# Patient Record
Sex: Female | Born: 1960 | Race: White | Hispanic: No | Marital: Married | Smoking: Never smoker
Health system: Southern US, Community
[De-identification: ages and names within clinical notes are randomized; demographics above are authoritative.]

## PROBLEM LIST (undated history)

## (undated) DIAGNOSIS — R011 Cardiac murmur, unspecified: Secondary | ICD-10-CM

## (undated) DIAGNOSIS — E039 Hypothyroidism, unspecified: Secondary | ICD-10-CM

## (undated) DIAGNOSIS — E079 Disorder of thyroid, unspecified: Secondary | ICD-10-CM

## (undated) DIAGNOSIS — G43909 Migraine, unspecified, not intractable, without status migrainosus: Secondary | ICD-10-CM

## (undated) DIAGNOSIS — M169 Osteoarthritis of hip, unspecified: Secondary | ICD-10-CM

## (undated) HISTORY — PX: BREAST EXCISIONAL BIOPSY: SUR124

## (undated) HISTORY — PX: ABDOMINAL HYSTERECTOMY: SHX81

## (undated) HISTORY — PX: TMJ ARTHROPLASTY: SHX1066

## (undated) HISTORY — PX: TONSILLECTOMY: SUR1361

## (undated) HISTORY — PX: TRIGGER FINGER RELEASE: SHX641

## (undated) HISTORY — PX: FOOT SURGERY: SHX648

---

## 2012-09-03 ENCOUNTER — Other Ambulatory Visit: Payer: Self-pay | Admitting: Internal Medicine

## 2012-09-03 DIAGNOSIS — Z1231 Encounter for screening mammogram for malignant neoplasm of breast: Secondary | ICD-10-CM

## 2012-09-26 ENCOUNTER — Ambulatory Visit
Admission: RE | Admit: 2012-09-26 | Discharge: 2012-09-26 | Disposition: A | Discharge status: Home / Self Care | Payer: Managed Care, Other (non HMO) | Source: Ambulatory Visit | Attending: Internal Medicine | Admitting: Internal Medicine

## 2012-09-26 ENCOUNTER — Ambulatory Visit: Payer: Self-pay

## 2012-09-26 DIAGNOSIS — Z1231 Encounter for screening mammogram for malignant neoplasm of breast: Secondary | ICD-10-CM

## 2012-09-27 ENCOUNTER — Other Ambulatory Visit: Payer: Self-pay | Admitting: Internal Medicine

## 2012-09-27 DIAGNOSIS — R928 Other abnormal and inconclusive findings on diagnostic imaging of breast: Secondary | ICD-10-CM

## 2012-10-15 ENCOUNTER — Ambulatory Visit
Admission: RE | Admit: 2012-10-15 | Discharge: 2012-10-15 | Disposition: A | Discharge status: Home / Self Care | Payer: Managed Care, Other (non HMO) | Source: Ambulatory Visit | Attending: Internal Medicine | Admitting: Internal Medicine

## 2012-10-15 DIAGNOSIS — R928 Other abnormal and inconclusive findings on diagnostic imaging of breast: Secondary | ICD-10-CM

## 2013-03-17 ENCOUNTER — Other Ambulatory Visit: Payer: Self-pay | Admitting: Internal Medicine

## 2013-03-17 DIAGNOSIS — N6310 Unspecified lump in the right breast, unspecified quadrant: Secondary | ICD-10-CM

## 2013-04-01 ENCOUNTER — Ambulatory Visit
Admission: RE | Admit: 2013-04-01 | Discharge: 2013-04-01 | Disposition: A | Discharge status: Home / Self Care | Payer: Self-pay | Source: Ambulatory Visit | Attending: Internal Medicine | Admitting: Internal Medicine

## 2013-04-01 ENCOUNTER — Ambulatory Visit
Admission: RE | Admit: 2013-04-01 | Discharge: 2013-04-01 | Disposition: A | Discharge status: Home / Self Care | Payer: Managed Care, Other (non HMO) | Source: Ambulatory Visit | Attending: Internal Medicine | Admitting: Internal Medicine

## 2013-04-01 ENCOUNTER — Other Ambulatory Visit: Payer: Self-pay | Admitting: Internal Medicine

## 2013-04-01 DIAGNOSIS — N6310 Unspecified lump in the right breast, unspecified quadrant: Secondary | ICD-10-CM

## 2013-04-29 ENCOUNTER — Other Ambulatory Visit: Payer: Self-pay | Admitting: Internal Medicine

## 2013-04-29 ENCOUNTER — Ambulatory Visit
Admission: RE | Admit: 2013-04-29 | Discharge: 2013-04-29 | Disposition: A | Discharge status: Home / Self Care | Payer: Managed Care, Other (non HMO) | Source: Ambulatory Visit | Attending: Internal Medicine | Admitting: Internal Medicine

## 2013-04-29 DIAGNOSIS — R0789 Other chest pain: Secondary | ICD-10-CM

## 2013-08-16 ENCOUNTER — Encounter (HOSPITAL_COMMUNITY): Payer: Self-pay | Admitting: Emergency Medicine

## 2013-08-16 ENCOUNTER — Emergency Department (HOSPITAL_COMMUNITY)
Admission: EM | Admit: 2013-08-16 | Discharge: 2013-08-16 | Disposition: A | Discharge status: Home / Self Care | Payer: Managed Care, Other (non HMO) | Attending: Emergency Medicine | Admitting: Emergency Medicine

## 2013-08-16 DIAGNOSIS — G43711 Chronic migraine without aura, intractable, with status migrainosus: Secondary | ICD-10-CM

## 2013-08-16 DIAGNOSIS — Z3202 Encounter for pregnancy test, result negative: Secondary | ICD-10-CM | POA: Insufficient documentation

## 2013-08-16 DIAGNOSIS — E079 Disorder of thyroid, unspecified: Secondary | ICD-10-CM | POA: Insufficient documentation

## 2013-08-16 DIAGNOSIS — G43009 Migraine without aura, not intractable, without status migrainosus: Secondary | ICD-10-CM | POA: Insufficient documentation

## 2013-08-16 DIAGNOSIS — Z79899 Other long term (current) drug therapy: Secondary | ICD-10-CM | POA: Insufficient documentation

## 2013-08-16 HISTORY — DX: Disorder of thyroid, unspecified: E07.9

## 2013-08-16 HISTORY — DX: Migraine, unspecified, not intractable, without status migrainosus: G43.909

## 2013-08-16 LAB — POC URINE PREG, ED: PREG TEST UR: NEGATIVE

## 2013-08-16 MED ORDER — HYDROMORPHONE HCL PF 1 MG/ML IJ SOLN
1.0000 mg | Freq: Once | INTRAMUSCULAR | Status: AC
  Administered 2013-08-16: 1 mg via INTRAVENOUS
  Filled 2013-08-16: qty 1

## 2013-08-16 MED ORDER — KETOROLAC TROMETHAMINE 30 MG/ML IJ SOLN
30.0000 mg | Freq: Once | INTRAMUSCULAR | Status: AC
  Administered 2013-08-16: 30 mg via INTRAVENOUS
  Filled 2013-08-16: qty 1

## 2013-08-16 MED ORDER — SODIUM CHLORIDE 0.9 % IV BOLUS (SEPSIS)
1000.0000 mL | Freq: Once | INTRAVENOUS | Status: AC
  Administered 2013-08-16: 1000 mL via INTRAVENOUS

## 2013-08-16 MED ORDER — PROMETHAZINE HCL 25 MG/ML IJ SOLN
12.5000 mg | Freq: Once | INTRAMUSCULAR | Status: AC
  Administered 2013-08-16: 12.5 mg via INTRAVENOUS
  Filled 2013-08-16: qty 1

## 2013-08-16 NOTE — Discharge Instructions (Signed)
Recurrent Migraine Headache °A migraine headache is very bad, throbbing pain on one or both sides of your head. Recurrent migraines keep coming back. Talk to your doctor about what things may bring on (trigger) your migraine headaches. °HOME CARE °· Only take medicines as told by your doctor. °· Lie down in a dark, quiet room when you have a migraine. °· Keep a journal to find out if certain things bring on migraine headaches. For example, write down: °¨ What you eat and drink. °¨ How much sleep you get. °¨ Any change to your diet or medicines. °· Lessen how much alcohol you drink. °· Quit smoking if you smoke. °· Get enough sleep. °· Lessen any stress in your life. °· Keep lights dim if bright lights bother you or make your migraines worse. °GET HELP IF: °· Medicine does not help your migraines. °· Your pain keeps coming back. °· You have a fever. °GET HELP RIGHT AWAY IF:  °· Your migraine becomes really bad. °· You have a stiff neck. °· You have trouble seeing. °· Your muscles are weak, or you lose muscle control. °· You lose your balance or have trouble walking. °· You feel like you will pass out (faint), or you pass out. °· You have really bad symptoms that are different than your first symptoms. °MAKE SURE YOU:  °· Understand these instructions. °· Will watch your condition. °· Will get help right away if you are not doing well or get worse. °Document Released: 11/16/2007 Document Revised: 02/11/2013 Document Reviewed: 10/14/2012 °Exit[employer]® Patient Information ©2015 [employer]. This information is not intended to replace advice given to you by your health care provider. Make sure you discuss any questions you have with your health care provider. ° °

## 2013-08-16 NOTE — ED Provider Notes (Signed)
CSN: [id number]     Arrival date & time 08/16/13  0447 History   First MD Initiated Contact with Patient 08/16/13 0701     Chief Complaint  Patient presents with  . Migraine     (Consider location/radiation/quality/duration/timing/severity/associated sxs/prior Treatment) HPI Comments: 53 y.o. Female with histrory of migraines for " a long time."  She is followed by Dr. Lavone Orn for primary care.  She [facility] Dr. Domingo Cocking yesterday.  She states she was given multiple steroid injections along neck for trigger point therapy.  She began having a migraine after that that began as her usual migraine.  She has been taken off all her migraine medications.  She states she usually uses maxalt but has been using it every day instead of just for relief.  This is because her headaches had become daily.  She also has nausea and vomiting with these.   She denies visual changes, head injury, sudden onset of headache, lateralized weakness, sore throat, neck stiffness, fever, chills or abdominal pain.  She feels this is her typical migraine.    Patient is a 53 y.o. female presenting with migraines. The history is provided by the patient.  Migraine This is a recurrent problem. The current episode started 12 to 24 hours ago. The problem occurs constantly. The problem has not changed since onset.Pertinent negatives include no chest pain, no abdominal pain, no headaches and no shortness of breath. Exacerbated by: [place]. Nothing relieves the symptoms. She has tried nothing for the symptoms. The treatment provided no relief.    Past Medical History  Diagnosis Date  . Migraine headache   . Thyroid disease    Past Surgical History  Procedure Laterality Date  . Tonsillectomy    . Abdominal hysterectomy    . Tmj arthroplasty    . Cesarean section    . Foot surgery     No family history on file. History  Substance Use Topics  . Smoking status: Never Smoker    . Smokeless tobacco: Not on file  . Alcohol Use: No   OB History   Grav Para Term Preterm Abortions TAB SAB Ect Mult Living                 Review of Systems  Respiratory: Negative for shortness of breath.   Cardiovascular: Negative for chest pain.  Gastrointestinal: Negative for abdominal pain.  Neurological: Negative for headaches.  All other systems reviewed and are negative.     Allergies  Indomethacin; Keflex; Percocet; and Tramadol  Home Medications   Prior to Admission medications   Medication Sig Start Date End Date Taking? Authorizing Provider  baclofen (LIORESAL) 10 MG tablet Take 10 mg by mouth 2 (two) times daily. 08/14/13  Yes Historical Provider, MD  levothyroxine (SYNTHROID, LEVOTHROID) 75 MCG tablet Take 75 mcg by mouth daily before breakfast.   Yes Historical Provider, MD  metoprolol tartrate (LOPRESSOR) 25 MG tablet Take 25 mg by mouth 2 (two) times daily. 07/08/13  Yes Historical Provider, MD  naratriptan (AMERGE) 2.5 MG tablet Take 2.5 mg by mouth daily. 07/22/13  Yes Historical Provider, MD  rizatriptan (MAXALT-MLT) 10 MG disintegrating tablet Take 10 mg by mouth as needed for migraine.  07/22/13  Yes Historical Provider, MD  zonisamide (ZONEGRAN) 25 MG capsule Take 25 mg by mouth 4 (four) times daily. 08/14/13  Yes Historical Provider, MD   BP 140/66  Pulse 70  Temp(Src) 97.9 F (36.6 C)  Resp 16  SpO2 98% Physical Exam  Nursing note and vitals reviewed. Constitutional: She is oriented to person, place, and time. She appears well-developed and well-nourished.  HENT:  Head: Normocephalic and atraumatic.  Right Ear: Tympanic membrane and external ear normal.  Left Ear: Tympanic membrane and external ear normal.  Nose: Nose normal. Right sinus exhibits no maxillary sinus tenderness and no frontal sinus tenderness. Left sinus exhibits no maxillary sinus tenderness and no frontal sinus tenderness.  Eyes: Conjunctivae and EOM are normal. Pupils are equal,  round, and reactive to light. Right eye exhibits no nystagmus. Left eye exhibits no nystagmus.  Neck: Normal range of motion. Neck supple.  Cardiovascular: Normal rate, regular rhythm, normal heart sounds and intact distal pulses.   Pulmonary/Chest: Effort normal and breath sounds normal. No respiratory distress. She exhibits no tenderness.  Abdominal: Soft. Bowel sounds are normal. She exhibits no distension and no mass. There is no tenderness.  Musculoskeletal: Normal range of motion. She exhibits no edema and no tenderness.  Neurological: She is alert and oriented to person, place, and time. She has normal strength and normal reflexes. No sensory deficit. She displays a negative Romberg sign. GCS eye subscore is 4. GCS verbal subscore is 5. GCS motor subscore is 6.  Reflex Scores:      Tricep reflexes are 2+ on the right side and 2+ on the left side.      Bicep reflexes are 2+ on the right side and 2+ on the left side.      Brachioradialis reflexes are 2+ on the right side and 2+ on the left side.      Patellar reflexes are 2+ on the right side and 2+ on the left side.      Achilles reflexes are 2+ on the right side and 2+ on the left side. Patient with normal gait without ataxia, shuffling, spasm, or antalgia. Speech is normal without dysarthria, dysphasia, or aphasia. Muscle strength is 5/5 in bilateral shoulders, elbow flexor and extensors, wrist flexor and extensors, and intrinsic hand muscles. 5/5 bilateral lower extremity hip flexors, extensors, knee flexors and extensors, and ankle dorsi and plantar flexors.    Skin: Skin is warm and dry. No rash noted.  Psychiatric: She has a normal mood and affect. Her behavior is normal. Judgment and thought content normal.    ED Course  Procedures (including critical care time) Labs Review Labs Reviewed  POC URINE PREG, ED    Imaging Review No results found.   EKG Interpretation None      MDM   Final diagnoses:  Intractable  chronic migraine without aura and with status migrainosus    53 year old female with history of migraines has been off of all her medications. She is given Toradol, Phenergan, and one half milligram of Dilaudid here in the emergency department with complete resolution of her symptoms. She is somewhat sleepy but continues to have a nonfocal neurological exam. I discussed this with her and her husband. Her husband is comfortable taking her home. I discussed return precautions and need for follow up in her voice understanding.    Shaune Pollack, MD 08/16/13 1031

## 2013-08-16 NOTE — ED Notes (Signed)
Pt. reports migraine headache with emesis and photophobia onset last night unrelieved by prescription medications .

## 2013-08-16 NOTE — ED Notes (Signed)
Dr. Jeanell Sparrow at the bedside.

## 2013-08-16 NOTE — ED Notes (Signed)
Pt up ambulated to the bathroom without any difficulty or distress; pt attempting to provide an urine specimen; family at bedside

## 2013-09-03 ENCOUNTER — Other Ambulatory Visit: Payer: Self-pay

## 2013-09-03 DIAGNOSIS — Z1231 Encounter for screening mammogram for malignant neoplasm of breast: Secondary | ICD-10-CM

## 2013-09-30 ENCOUNTER — Ambulatory Visit: Payer: Managed Care, Other (non HMO)

## 2013-10-14 ENCOUNTER — Other Ambulatory Visit: Payer: Self-pay | Admitting: Internal Medicine

## 2013-10-14 ENCOUNTER — Ambulatory Visit
Admission: RE | Admit: 2013-10-14 | Discharge: 2013-10-14 | Disposition: A | Discharge status: Home / Self Care | Payer: Managed Care, Other (non HMO) | Source: Ambulatory Visit | Attending: Internal Medicine | Admitting: Internal Medicine

## 2013-10-14 DIAGNOSIS — T1490XA Injury, unspecified, initial encounter: Secondary | ICD-10-CM

## 2013-10-14 DIAGNOSIS — W219XXA Striking against or struck by unspecified sports equipment, initial encounter: Secondary | ICD-10-CM

## 2013-10-20 ENCOUNTER — Ambulatory Visit
Admission: RE | Admit: 2013-10-20 | Discharge: 2013-10-20 | Disposition: A | Discharge status: Home / Self Care | Payer: Managed Care, Other (non HMO) | Source: Ambulatory Visit

## 2013-10-20 DIAGNOSIS — Z1231 Encounter for screening mammogram for malignant neoplasm of breast: Secondary | ICD-10-CM

## 2014-11-11 ENCOUNTER — Other Ambulatory Visit: Payer: Self-pay

## 2014-11-11 DIAGNOSIS — Z1231 Encounter for screening mammogram for malignant neoplasm of breast: Secondary | ICD-10-CM

## 2014-11-25 ENCOUNTER — Ambulatory Visit
Admission: RE | Admit: 2014-11-25 | Discharge: 2014-11-25 | Disposition: A | Discharge status: Home / Self Care | Payer: Managed Care, Other (non HMO) | Source: Ambulatory Visit

## 2014-11-25 DIAGNOSIS — Z1231 Encounter for screening mammogram for malignant neoplasm of breast: Secondary | ICD-10-CM

## 2015-10-15 ENCOUNTER — Ambulatory Visit
Admission: RE | Admit: 2015-10-15 | Discharge: 2015-10-15 | Disposition: A | Discharge status: Home / Self Care | Payer: 59 | Source: Ambulatory Visit | Attending: Internal Medicine | Admitting: Internal Medicine

## 2015-10-15 ENCOUNTER — Other Ambulatory Visit: Payer: Self-pay | Admitting: Internal Medicine

## 2015-10-15 DIAGNOSIS — M25571 Pain in right ankle and joints of right foot: Secondary | ICD-10-CM

## 2015-10-15 DIAGNOSIS — R1032 Left lower quadrant pain: Secondary | ICD-10-CM

## 2015-12-09 ENCOUNTER — Other Ambulatory Visit: Payer: Self-pay | Admitting: Internal Medicine

## 2015-12-09 DIAGNOSIS — Z1231 Encounter for screening mammogram for malignant neoplasm of breast: Secondary | ICD-10-CM

## 2016-01-03 ENCOUNTER — Ambulatory Visit
Admission: RE | Admit: 2016-01-03 | Discharge: 2016-01-03 | Disposition: A | Discharge status: Home / Self Care | Payer: 59 | Source: Ambulatory Visit | Attending: Internal Medicine | Admitting: Internal Medicine

## 2016-01-03 DIAGNOSIS — Z1231 Encounter for screening mammogram for malignant neoplasm of breast: Secondary | ICD-10-CM

## 2016-03-13 DIAGNOSIS — E890 Postprocedural hypothyroidism: Secondary | ICD-10-CM | POA: Diagnosis not present

## 2016-03-13 DIAGNOSIS — R195 Other fecal abnormalities: Secondary | ICD-10-CM | POA: Diagnosis not present

## 2016-03-13 DIAGNOSIS — R8290 Unspecified abnormal findings in urine: Secondary | ICD-10-CM | POA: Diagnosis not present

## 2016-04-20 DIAGNOSIS — M549 Dorsalgia, unspecified: Secondary | ICD-10-CM | POA: Diagnosis not present

## 2016-04-20 DIAGNOSIS — Z13220 Encounter for screening for lipoid disorders: Secondary | ICD-10-CM | POA: Diagnosis not present

## 2016-04-20 DIAGNOSIS — K921 Melena: Secondary | ICD-10-CM | POA: Diagnosis not present

## 2016-04-20 DIAGNOSIS — R8290 Unspecified abnormal findings in urine: Secondary | ICD-10-CM | POA: Diagnosis not present

## 2016-04-26 DIAGNOSIS — Z8371 Family history of colonic polyps: Secondary | ICD-10-CM | POA: Diagnosis not present

## 2016-04-26 DIAGNOSIS — K625 Hemorrhage of anus and rectum: Secondary | ICD-10-CM | POA: Diagnosis not present

## 2016-06-05 DIAGNOSIS — D120 Benign neoplasm of cecum: Secondary | ICD-10-CM | POA: Diagnosis not present

## 2016-06-05 DIAGNOSIS — K625 Hemorrhage of anus and rectum: Secondary | ICD-10-CM | POA: Diagnosis not present

## 2016-06-05 DIAGNOSIS — K529 Noninfective gastroenteritis and colitis, unspecified: Secondary | ICD-10-CM | POA: Diagnosis not present

## 2016-06-05 DIAGNOSIS — D122 Benign neoplasm of ascending colon: Secondary | ICD-10-CM | POA: Diagnosis not present

## 2016-08-01 DIAGNOSIS — N949 Unspecified condition associated with female genital organs and menstrual cycle: Secondary | ICD-10-CM | POA: Diagnosis not present

## 2016-08-09 DIAGNOSIS — N949 Unspecified condition associated with female genital organs and menstrual cycle: Secondary | ICD-10-CM | POA: Diagnosis not present

## 2016-09-11 DIAGNOSIS — R5383 Other fatigue: Secondary | ICD-10-CM | POA: Diagnosis not present

## 2016-09-11 DIAGNOSIS — E890 Postprocedural hypothyroidism: Secondary | ICD-10-CM | POA: Diagnosis not present

## 2016-11-06 DIAGNOSIS — E890 Postprocedural hypothyroidism: Secondary | ICD-10-CM | POA: Diagnosis not present

## 2016-11-23 DIAGNOSIS — M9905 Segmental and somatic dysfunction of pelvic region: Secondary | ICD-10-CM | POA: Diagnosis not present

## 2016-11-23 DIAGNOSIS — M9901 Segmental and somatic dysfunction of cervical region: Secondary | ICD-10-CM | POA: Diagnosis not present

## 2016-11-23 DIAGNOSIS — M50322 Other cervical disc degeneration at C5-C6 level: Secondary | ICD-10-CM | POA: Diagnosis not present

## 2016-11-24 ENCOUNTER — Other Ambulatory Visit: Payer: Self-pay | Admitting: Internal Medicine

## 2016-11-24 DIAGNOSIS — Z1231 Encounter for screening mammogram for malignant neoplasm of breast: Secondary | ICD-10-CM

## 2016-11-26 ENCOUNTER — Emergency Department (HOSPITAL_COMMUNITY)
Admission: EM | Admit: 2016-11-26 | Discharge: 2016-11-27 | Disposition: A | Discharge status: Home / Self Care | Payer: 59 | Attending: Emergency Medicine | Admitting: Emergency Medicine

## 2016-11-26 ENCOUNTER — Encounter (HOSPITAL_COMMUNITY): Payer: Self-pay | Admitting: *Deleted

## 2016-11-26 DIAGNOSIS — G43009 Migraine without aura, not intractable, without status migrainosus: Secondary | ICD-10-CM | POA: Diagnosis not present

## 2016-11-26 DIAGNOSIS — G43109 Migraine with aura, not intractable, without status migrainosus: Secondary | ICD-10-CM | POA: Diagnosis not present

## 2016-11-26 DIAGNOSIS — R51 Headache: Secondary | ICD-10-CM | POA: Diagnosis not present

## 2016-11-26 DIAGNOSIS — Z79899 Other long term (current) drug therapy: Secondary | ICD-10-CM | POA: Insufficient documentation

## 2016-11-26 MED ORDER — METOCLOPRAMIDE HCL 5 MG/ML IJ SOLN
10.0000 mg | Freq: Once | INTRAMUSCULAR | Status: AC
  Administered 2016-11-27: 10 mg via INTRAVENOUS
  Filled 2016-11-26: qty 2

## 2016-11-26 MED ORDER — DIPHENHYDRAMINE HCL 50 MG/ML IJ SOLN
12.5000 mg | Freq: Once | INTRAMUSCULAR | Status: AC
  Administered 2016-11-27: 12.5 mg via INTRAVENOUS
  Filled 2016-11-26: qty 1

## 2016-11-26 MED ORDER — SODIUM CHLORIDE 0.9 % IV BOLUS (SEPSIS)
1000.0000 mL | Freq: Once | INTRAVENOUS | Status: AC
  Administered 2016-11-27: 1000 mL via INTRAVENOUS

## 2016-11-26 MED ORDER — KETOROLAC TROMETHAMINE 30 MG/ML IJ SOLN
30.0000 mg | Freq: Once | INTRAMUSCULAR | Status: AC
  Administered 2016-11-27: 30 mg via INTRAVENOUS
  Filled 2016-11-26: qty 1

## 2016-11-26 NOTE — ED Triage Notes (Addendum)
Pt c/o headache with n/v that started yesterday, was seen at urgent care today, given toradol and two other shots with no improvement in symptoms, pt states that the headache is light and sound sensitive,

## 2016-11-27 DIAGNOSIS — M50322 Other cervical disc degeneration at C5-C6 level: Secondary | ICD-10-CM | POA: Diagnosis not present

## 2016-11-27 DIAGNOSIS — M9905 Segmental and somatic dysfunction of pelvic region: Secondary | ICD-10-CM | POA: Diagnosis not present

## 2016-11-27 DIAGNOSIS — M9901 Segmental and somatic dysfunction of cervical region: Secondary | ICD-10-CM | POA: Diagnosis not present

## 2016-11-27 MED ORDER — DIPHENHYDRAMINE HCL 50 MG/ML IJ SOLN
12.5000 mg | Freq: Once | INTRAMUSCULAR | Status: AC
  Administered 2016-11-27: 12.5 mg via INTRAVENOUS
  Filled 2016-11-27: qty 1

## 2016-11-27 MED ORDER — DIPHENHYDRAMINE HCL 12.5 MG/5ML PO ELIX: 12.5000 mg | ORAL_SOLUTION | Freq: Once | ORAL | Status: DC

## 2016-11-27 NOTE — Discharge Instructions (Signed)
Follow-up with your doctor this week.  Return for any worsening symptoms

## 2016-11-27 NOTE — ED Provider Notes (Signed)
[facility] DEPT Provider Note   CSN: [id number] Arrival date & time: 11/26/16  2157     History   Chief Complaint Chief Complaint  Patient presents with  . Headache    HPI Laneya Mitchell is a 56 y.o. female.  HPI   Brandi Mitchell is a 56 y.o. female who presents to the Emergency Department complaining of gradual onset of frontal headache that began on the morning of ER arrival.  States the headache was minimal this morning and has progressively worsening throughout the day.  She describes a throbbing sensation across her for head. She states she is sensitive to bright lights and sound. Headache is similar to previous. She was seen this afternoon at a local urgent care and was given 3 injections of medications without relief. She states that she was told by the provider that he did not have adequate quantity of the medication that she needed and was told if the symptoms did not improve that she needed to be seen at the emergency department. She denies recent illness, fever, visual deficits, neck pain or stiffness, facial or extremity weakness, changes in speech or rash.     Past Medical History:  Diagnosis Date  . Migraine headache   . Thyroid disease     There are no active problems to display for this patient.   Past Surgical History:  Procedure Laterality Date  . ABDOMINAL HYSTERECTOMY    . CESAREAN SECTION    . FOOT SURGERY    . TMJ ARTHROPLASTY    . TONSILLECTOMY      OB History    No data available       Home Medications    Prior to Admission medications   Medication Sig Start Date End Date Taking? Authorizing Provider  baclofen (LIORESAL) 10 MG tablet Take 10 mg by mouth 2 (two) times daily. 08/14/13   [provider]  levothyroxine (SYNTHROID, LEVOTHROID) 75 MCG tablet Take 75 mcg by mouth daily before breakfast.    [provider]  metoprolol tartrate (LOPRESSOR) 25 MG tablet Take 25 mg by mouth 2 (two) times daily. 07/08/13   [provider]  naratriptan (AMERGE) 2.5 MG tablet Take 2.5 mg by mouth daily. 07/22/13   [provider]  rizatriptan (MAXALT-MLT) 10 MG disintegrating tablet Take 10 mg by mouth as needed for migraine.  07/22/13   [provider]  zonisamide (ZONEGRAN) 25 MG capsule Take 25 mg by mouth 4 (four) times daily. 08/14/13   [provider]    Family History No family history on file.  Social History Social History  Substance Use Topics  . Smoking status: Never Smoker  . Smokeless tobacco: Never Used  . Alcohol use No     Allergies   Indomethacin; Keflex [cephalexin]; Percocet [oxycodone-acetaminophen]; and Tramadol   Review of Systems Review of Systems  Constitutional: Negative for activity change, appetite change and fever.  HENT: Negative for facial swelling and trouble swallowing.   Eyes: Positive for photophobia. Negative for pain and visual disturbance.  Respiratory: Negative for shortness of breath.   Cardiovascular: Negative for chest pain.  Gastrointestinal: Negative for nausea and vomiting.  Musculoskeletal: Negative for neck pain and neck stiffness.  Skin: Negative for rash and wound.  Neurological: Positive for headaches. Negative for dizziness, facial asymmetry, speech difficulty, weakness and numbness.  Psychiatric/Behavioral: Negative for confusion and decreased concentration.  All other systems reviewed and are negative.    Physical Exam Updated Vital Signs BP (!) 149/83  Pulse 74   Temp 98.5 F (36.9 C) (Oral)   Resp 16   Ht 5\' 1"  (1.549 m)   Wt 47.6 kg (105 lb)   SpO2 97%   BMI 19.84 kg/m   Physical Exam  Constitutional: She is oriented to person, place, and time. She appears well-developed and well-nourished. No distress.  HENT:  Head: Normocephalic and atraumatic.  Mouth/Throat: Oropharynx is clear and moist.  Eyes: Pupils are equal, round, and reactive to light. EOM are normal.  Neck: Normal range of motion and phonation  normal. Neck supple. No spinous process tenderness and no muscular tenderness present. No neck rigidity. No Brudzinski's sign and no Kernig's sign noted.  Cardiovascular: Normal rate, regular rhythm, normal heart sounds and intact distal pulses.   No murmur heard. Pulmonary/Chest: Effort normal and breath sounds normal. No respiratory distress.  Abdominal: Soft. She exhibits no distension. There is no tenderness.  Musculoskeletal: Normal range of motion.  Neurological: She is alert and oriented to person, place, and time. She has normal strength. No cranial nerve deficit or sensory deficit. She exhibits normal muscle tone. Coordination and gait normal. GCS eye subscore is 4. GCS verbal subscore is 5. GCS motor subscore is 6.  Reflex Scores:      Tricep reflexes are 2+ on the right side and 2+ on the left side.      Bicep reflexes are 2+ on the right side and 2+ on the left side. Speech clear, no facial droop or pronator drift, 5/5 motor strength of bilateral upper and lower extremities. CN II-XII intact  Skin: Skin is warm and dry. Capillary refill takes less than 2 seconds. No rash noted.  Psychiatric: She has a normal mood and affect. Thought content normal.  Nursing note and vitals reviewed.    ED Treatments / Results  Labs (all labs ordered are listed, but only abnormal results are displayed) Labs Reviewed - No data to display  EKG  EKG Interpretation None       Radiology No results found.  Procedures Procedures (including critical care time)  Medications Ordered in ED Medications  sodium chloride 0.9 % bolus 1,000 mL (1,000 mLs Intravenous New Bag/Given 11/27/16 0029)  ketorolac (TORADOL) 30 MG/ML injection 30 mg (30 mg Intravenous Given 11/27/16 0030)  metoCLOPramide (REGLAN) injection 10 mg (10 mg Intravenous Given 11/27/16 0030)  diphenhydrAMINE (BENADRYL) injection 12.5 mg (12.5 mg Intravenous Given 11/27/16 0030)     Initial Impression / Assessment and Plan / ED  Course  I have reviewed the triage vital signs and the nursing notes.  Pertinent labs & imaging results that were available during my care of the patient were reviewed by me and considered in my medical decision making (see chart for details).     Pt is non-toxic appearing.  No focal neuro deficits, no nuchal rigidity. Headache tonight similar to previous migraines. No vomiting during ED stay.    On recheck, pt feeling better and states she is ready for discharge.    Final Clinical Impressions(s) / ED Diagnoses   Final diagnoses:  Migraine without aura and without status migrainosus, not intractable    New Prescriptions New Prescriptions   No medications on file     Kem Parkinson, PA-C 11/27/16 0155    Ripley Fraise, MD 11/27/16 [id number]

## 2016-11-27 NOTE — ED Notes (Signed)
20 gauge iv removed from right wrist area, pt tolerated well, site wnl'

## 2016-11-28 DIAGNOSIS — I10 Essential (primary) hypertension: Secondary | ICD-10-CM | POA: Diagnosis not present

## 2016-12-04 DIAGNOSIS — M542 Cervicalgia: Secondary | ICD-10-CM | POA: Diagnosis not present

## 2016-12-04 DIAGNOSIS — M545 Low back pain: Secondary | ICD-10-CM | POA: Diagnosis not present

## 2016-12-07 DIAGNOSIS — M542 Cervicalgia: Secondary | ICD-10-CM | POA: Diagnosis not present

## 2016-12-07 DIAGNOSIS — M545 Low back pain: Secondary | ICD-10-CM | POA: Diagnosis not present

## 2016-12-08 DIAGNOSIS — M542 Cervicalgia: Secondary | ICD-10-CM | POA: Diagnosis not present

## 2016-12-08 DIAGNOSIS — M545 Low back pain: Secondary | ICD-10-CM | POA: Diagnosis not present

## 2016-12-11 DIAGNOSIS — M545 Low back pain: Secondary | ICD-10-CM | POA: Diagnosis not present

## 2016-12-11 DIAGNOSIS — M160 Bilateral primary osteoarthritis of hip: Secondary | ICD-10-CM | POA: Diagnosis not present

## 2016-12-11 DIAGNOSIS — M542 Cervicalgia: Secondary | ICD-10-CM | POA: Diagnosis not present

## 2016-12-13 DIAGNOSIS — M542 Cervicalgia: Secondary | ICD-10-CM | POA: Diagnosis not present

## 2016-12-13 DIAGNOSIS — M545 Low back pain: Secondary | ICD-10-CM | POA: Diagnosis not present

## 2016-12-18 DIAGNOSIS — M542 Cervicalgia: Secondary | ICD-10-CM | POA: Diagnosis not present

## 2016-12-18 DIAGNOSIS — M545 Low back pain: Secondary | ICD-10-CM | POA: Diagnosis not present

## 2016-12-20 DIAGNOSIS — M542 Cervicalgia: Secondary | ICD-10-CM | POA: Diagnosis not present

## 2016-12-20 DIAGNOSIS — M545 Low back pain: Secondary | ICD-10-CM | POA: Diagnosis not present

## 2016-12-25 DIAGNOSIS — M545 Low back pain: Secondary | ICD-10-CM | POA: Diagnosis not present

## 2016-12-25 DIAGNOSIS — M542 Cervicalgia: Secondary | ICD-10-CM | POA: Diagnosis not present

## 2016-12-26 DIAGNOSIS — M1612 Unilateral primary osteoarthritis, left hip: Secondary | ICD-10-CM | POA: Diagnosis not present

## 2017-01-01 DIAGNOSIS — M542 Cervicalgia: Secondary | ICD-10-CM | POA: Diagnosis not present

## 2017-01-01 DIAGNOSIS — M545 Low back pain: Secondary | ICD-10-CM | POA: Diagnosis not present

## 2017-01-04 ENCOUNTER — Ambulatory Visit
Admission: RE | Admit: 2017-01-04 | Discharge: 2017-01-04 | Disposition: A | Discharge status: Home / Self Care | Payer: 59 | Source: Ambulatory Visit | Attending: Internal Medicine | Admitting: Internal Medicine

## 2017-01-04 DIAGNOSIS — Z1231 Encounter for screening mammogram for malignant neoplasm of breast: Secondary | ICD-10-CM | POA: Diagnosis not present

## 2017-01-05 DIAGNOSIS — M545 Low back pain: Secondary | ICD-10-CM | POA: Diagnosis not present

## 2017-01-05 DIAGNOSIS — M542 Cervicalgia: Secondary | ICD-10-CM | POA: Diagnosis not present

## 2017-01-08 DIAGNOSIS — M160 Bilateral primary osteoarthritis of hip: Secondary | ICD-10-CM | POA: Diagnosis not present

## 2017-02-28 ENCOUNTER — Other Ambulatory Visit: Payer: Self-pay | Admitting: Orthopaedic Surgery

## 2017-03-05 ENCOUNTER — Other Ambulatory Visit: Payer: Self-pay | Admitting: Orthopaedic Surgery

## 2017-03-06 NOTE — Pre-Procedure Instructions (Signed)
Parrish Eskew  03/06/2017      [id number] - [place], [place] - [address] AT [address] [place] [address] Phone: [phone] Fax: [phone]    Your procedure is scheduled on January 22  Report to [facility] at [place].M.  Call this number if you have problems the morning of surgery:  [phone]   Remember:  Do not eat food or drink liquids after midnight.  Continue all medications as directed by your physician except follow these medication instructions before surgery below   Take these medicines the morning of surgery with A SIP OF WATER  levothyroxine (SYNTHROID, LEVOTHROID)   7 days prior to surgery STOP taking any Aspirin(unless otherwise instructed by your surgeon), Aleve, Naproxen, Ibuprofen, Motrin, Advil, Goody's, BC's, all herbal medications, fish oil, and all vitamins    Do not wear jewelry, make-up or nail polish.  Do not wear lotions, powders, or perfumes, or deodorant.  Do not shave 48 hours prior to surgery.  Do not bring valuables to the hospital.  [facility] is not responsible for any belongings or valuables.  Contacts, dentures or bridgework may not be worn into surgery.  Leave your suitcase in the car.  After surgery it may be brought to your room.  For patients admitted to the hospital, discharge time will be determined by your treatment team.  Patients discharged the day of surgery will not be allowed to drive home.    Special instructions:   [facility]- Preparing For Surgery  Before surgery, you can play an important role. Because skin is not sterile, your skin needs to be as free of germs as possible. You can reduce the number of germs on your skin by washing with CHG (chlorahexidine gluconate) Soap before surgery.  CHG is an antiseptic cleaner which kills germs and bonds with the skin to continue killing germs even after washing.  Please do not use if you  have an allergy to CHG or antibacterial soaps. If your skin becomes reddened/irritated stop using the CHG.  Do not shave (including legs and underarms) for at least 48 hours prior to first CHG shower. It is OK to shave your face.  Please follow these instructions carefully.   1. Shower the NIGHT BEFORE SURGERY and the MORNING OF SURGERY with CHG.   2. If you chose to wash your hair, wash your hair first as usual with your normal shampoo.  3. After you shampoo, rinse your hair and body thoroughly to remove the shampoo.  4. Use CHG as you would any other liquid soap. You can apply CHG directly to the skin and wash gently with a scrungie or a clean washcloth.   5. Apply the CHG Soap to your body ONLY FROM THE NECK DOWN.  Do not use on open wounds or open sores. Avoid contact with your eyes, ears, mouth and genitals (private parts). Wash Face and genitals (private parts)  with your normal soap.  6. Wash thoroughly, paying special attention to the area where your surgery will be performed.  7. Thoroughly rinse your body with warm water from the neck down.  8. DO NOT shower/wash with your normal soap after using and rinsing off the CHG Soap.  9. Pat yourself dry with a CLEAN TOWEL.  10. Wear CLEAN PAJAMAS to bed the night before surgery, wear comfortable clothes the morning of surgery  11. Place CLEAN SHEETS on your bed the night of  your first shower and DO NOT SLEEP WITH PETS.    Day of Surgery: Do not apply any deodorants/lotions. Please wear clean clothes to the hospital/surgery center.      Please read over the following fact sheets that you were given.

## 2017-03-07 ENCOUNTER — Encounter (HOSPITAL_COMMUNITY): Payer: Self-pay

## 2017-03-07 ENCOUNTER — Encounter (HOSPITAL_COMMUNITY)
Admission: RE | Admit: 2017-03-07 | Discharge: 2017-03-07 | Disposition: A | Discharge status: Home / Self Care | Payer: 59 | Source: Ambulatory Visit | Attending: Orthopaedic Surgery | Admitting: Orthopaedic Surgery

## 2017-03-07 ENCOUNTER — Other Ambulatory Visit: Payer: Self-pay

## 2017-03-07 ENCOUNTER — Ambulatory Visit (HOSPITAL_COMMUNITY)
Admission: RE | Admit: 2017-03-07 | Discharge: 2017-03-07 | Disposition: A | Discharge status: Home / Self Care | Payer: 59 | Source: Ambulatory Visit | Attending: Orthopaedic Surgery | Admitting: Orthopaedic Surgery

## 2017-03-07 DIAGNOSIS — Z01818 Encounter for other preprocedural examination: Secondary | ICD-10-CM | POA: Insufficient documentation

## 2017-03-07 DIAGNOSIS — E079 Disorder of thyroid, unspecified: Secondary | ICD-10-CM | POA: Diagnosis not present

## 2017-03-07 DIAGNOSIS — R011 Cardiac murmur, unspecified: Secondary | ICD-10-CM | POA: Insufficient documentation

## 2017-03-07 DIAGNOSIS — G43909 Migraine, unspecified, not intractable, without status migrainosus: Secondary | ICD-10-CM | POA: Insufficient documentation

## 2017-03-07 DIAGNOSIS — M1612 Unilateral primary osteoarthritis, left hip: Secondary | ICD-10-CM | POA: Insufficient documentation

## 2017-03-07 DIAGNOSIS — Z01812 Encounter for preprocedural laboratory examination: Secondary | ICD-10-CM | POA: Insufficient documentation

## 2017-03-07 HISTORY — DX: Osteoarthritis of hip, unspecified: M16.9

## 2017-03-07 HISTORY — DX: Cardiac murmur, unspecified: R01.1

## 2017-03-07 LAB — URINALYSIS, ROUTINE W REFLEX MICROSCOPIC
BILIRUBIN URINE: NEGATIVE
Bacteria, UA: NONE SEEN
GLUCOSE, UA: NEGATIVE mg/dL
KETONES UR: NEGATIVE mg/dL
LEUKOCYTES UA: NEGATIVE
NITRITE: NEGATIVE
PH: 7 (ref 5.0–8.0)
Protein, ur: NEGATIVE mg/dL
RBC / HPF: NONE SEEN RBC/hpf (ref 0–5)
Specific Gravity, Urine: 1.002 — ABNORMAL LOW (ref 1.005–1.030)
Squamous Epithelial / LPF: NONE SEEN
WBC, UA: NONE SEEN WBC/hpf (ref 0–5)

## 2017-03-07 LAB — CBC WITH DIFFERENTIAL/PLATELET
Basophils Absolute: 0 10*3/uL (ref 0.0–0.1)
Basophils Relative: 1 %
EOS ABS: 0 10*3/uL (ref 0.0–0.7)
Eosinophils Relative: 1 %
HCT: 42.5 % (ref 36.0–46.0)
HEMOGLOBIN: 14.2 g/dL (ref 12.0–15.0)
LYMPHS ABS: 1.4 10*3/uL (ref 0.7–4.0)
Lymphocytes Relative: 33 %
MCH: 28.3 pg (ref 26.0–34.0)
MCHC: 33.4 g/dL (ref 30.0–36.0)
MCV: 84.7 fL (ref 78.0–100.0)
MONO ABS: 0.4 10*3/uL (ref 0.1–1.0)
MONOS PCT: 10 %
NEUTROS PCT: 55 %
Neutro Abs: 2.3 10*3/uL (ref 1.7–7.7)
Platelets: 301 10*3/uL (ref 150–400)
RBC: 5.02 MIL/uL (ref 3.87–5.11)
RDW: 13.1 % (ref 11.5–15.5)
WBC: 4.2 10*3/uL (ref 4.0–10.5)

## 2017-03-07 LAB — BASIC METABOLIC PANEL
Anion gap: 9 (ref 5–15)
BUN: 11 mg/dL (ref 6–20)
CO2: 25 mmol/L (ref 22–32)
CREATININE: 0.66 mg/dL (ref 0.44–1.00)
Calcium: 9.1 mg/dL (ref 8.9–10.3)
Chloride: 106 mmol/L (ref 101–111)
GFR calc non Af Amer: >60 mL/min (ref >60)
GLUCOSE: 102 mg/dL — AB (ref 65–99)
Potassium: 4.1 mmol/L (ref 3.5–5.1)
Sodium: 140 mmol/L (ref 135–145)

## 2017-03-07 LAB — SURGICAL PCR SCREEN
MRSA, PCR: NEGATIVE
STAPHYLOCOCCUS AUREUS: NEGATIVE

## 2017-03-07 LAB — TYPE AND SCREEN
ABO/RH(D): O NEG
Antibody Screen: NEGATIVE

## 2017-03-07 LAB — PROTIME-INR
INR: 1.04
Prothrombin Time: 13.5 seconds (ref 11.4–15.2)

## 2017-03-07 LAB — APTT: aPTT: 30 seconds (ref 24–36)

## 2017-03-07 LAB — ABO/RH: ABO/RH(D): O NEG

## 2017-03-07 NOTE — Progress Notes (Signed)
PCP - Dr. Lavone Orn Cardiologist -   Chest x-ray - 03/07/2017 EKG - n/a Stress Test - patient denies ECHO - patient denies Cardiac Cath - patient denies  Sleep Study - patient denies  Anesthesia review: No  Patient denies shortness of breath, fever, cough and chest pain at PAT appointment   Patient verbalized understanding of instructions that were given to them at the PAT appointment. Patient was also instructed that they will need to review over the PAT instructions again at home before surgery.

## 2017-03-12 MED ORDER — TRANEXAMIC ACID 1000 MG/10ML IV SOLN
2000.0000 mg | INTRAVENOUS | Status: AC
  Administered 2017-03-13: 2000 mg via TOPICAL
  Filled 2017-03-12: qty 20

## 2017-03-12 MED ORDER — TRANEXAMIC ACID 1000 MG/10ML IV SOLN
1000.0000 mg | INTRAVENOUS | Status: AC
  Administered 2017-03-13: 1000 mg via INTRAVENOUS
  Filled 2017-03-12: qty 10

## 2017-03-12 NOTE — H&P (Signed)
TOTAL HIP ADMISSION H&P  Patient is admitted for left total hip arthroplasty.  Subjective:  Chief Complaint: left hip pain  HPI: Brandi Mitchell, 57 y.o. female, has a history of pain and functional disability in the left hip(s) due to arthritis and patient has failed non-surgical conservative treatments for greater than 12 weeks to include NSAID's and/or analgesics, corticosteriod injections, flexibility and strengthening excercises, use of assistive devices, weight reduction as appropriate and activity modification.  Onset of symptoms was gradual starting 5 years ago with gradually worsening course since that time.The patient noted no past surgery on the left hip(s).  Patient currently rates pain in the left hip at 10 out of 10 with activity. Patient has night pain, worsening of pain with activity and weight bearing, trendelenberg gait, pain that interfers with activities of daily living and crepitus. Patient has evidence of subchondral cysts, subchondral sclerosis, periarticular osteophytes and joint space narrowing by imaging studies. This condition presents safety issues increasing the risk of falls. There is no current active infection.  There are no active problems to display for this patient.  Past Medical History:  Diagnosis Date  . Degenerative joint disease (DJD) of hip    left hip  . Heart murmur    "I have had it all my life but no issues with it"  . Migraine headache   . Thyroid disease     Past Surgical History:  Procedure Laterality Date  . ABDOMINAL HYSTERECTOMY    . BREAST EXCISIONAL BIOPSY Left   . CESAREAN SECTION    . FOOT SURGERY    . TMJ ARTHROPLASTY    . TONSILLECTOMY      Current Facility-Administered Medications  Medication Dose Route Frequency Provider Last Rate Last Dose  . [START ON 03/13/2017] tranexamic acid (CYKLOKAPRON) 2,000 mg in sodium chloride 0.9 % 50 mL Topical Application  1,607 mg Topical To Brandi Maus, MD       Current Outpatient  Medications  Medication Sig Dispense Refill Last Dose  . levothyroxine (SYNTHROID, LEVOTHROID) 75 MCG tablet Take 75 mcg by mouth daily before breakfast.   08/15/2013 at Unknown time  . naproxen sodium (ALEVE) 220 MG tablet Take 440 mg by mouth daily as needed (for pain or headache).      Allergies  Allergen Reactions  . Indomethacin Nausea Only  . Keflex [Cephalexin] Nausea Only  . Percocet [Oxycodone-Acetaminophen] Nausea Only  . Tramadol Nausea Only    Social History   Tobacco Use  . Smoking status: Never Smoker  . Smokeless tobacco: Never Used  Substance Use Topics  . Alcohol use: No    No family history on file.   Review of Systems  Musculoskeletal: Positive for joint pain.       Left hip  All other systems reviewed and are negative.   Objective:  Physical Exam  Constitutional: She is oriented to person, place, and time. She appears well-developed and well-nourished.  HENT:  Head: Normocephalic and atraumatic.  Eyes: Pupils are equal, round, and reactive to light.  Neck: Normal range of motion.  Cardiovascular: Normal rate and regular rhythm.  Respiratory: Effort normal.  GI: Soft.  Musculoskeletal:  Left hip has very limited rotation all of which is fairly painful.  She walks with an antalgic gait.  Her leg lengths seem roughly equal.  Sensation and motor function are intact distally with palpable pulses in her feet.  She has no palpable lymphadenopathy at the groin.  Neurological: She is alert and oriented to person,  place, and time.  Skin: Skin is warm and dry.  Psychiatric: She has a normal mood and affect. Her behavior is normal. Judgment and thought content normal.    Vital signs in last 24 hours:    Labs:   Estimated body mass index is 22.35 kg/m as calculated from the following:   Height as of 03/07/17: 5\' 1"  (1.549 m).   Weight as of 03/07/17: 53.7 kg (118 lb 4.8 oz).   Imaging Review Plain radiographs demonstrate severe degenerative joint disease  of the left hip(s). The bone quality appears to be good for age and reported activity level.  Assessment/Plan:  End stage Primary arthritis, left hip(s)  The patient history, physical examination, clinical judgement of the provider and imaging studies are consistent with end stage degenerative joint disease of the left hip(s) and total hip arthroplasty is deemed medically necessary. The treatment options including medical management, injection therapy, arthroscopy and arthroplasty were discussed at length. The risks and benefits of total hip arthroplasty were presented and reviewed. The risks due to aseptic loosening, infection, stiffness, dislocation/subluxation,  thromboembolic complications and other imponderables were discussed.  The patient acknowledged the explanation, agreed to proceed with the plan and consent was signed. Patient is being admitted for inpatient treatment for surgery, pain control, PT, OT, prophylactic antibiotics, VTE prophylaxis, progressive ambulation and ADL's and discharge planning.The patient is planning to be discharged home with home health services

## 2017-03-13 ENCOUNTER — Inpatient Hospital Stay (HOSPITAL_COMMUNITY): Payer: 59 | Admitting: Anesthesiology

## 2017-03-13 ENCOUNTER — Inpatient Hospital Stay (HOSPITAL_COMMUNITY): Payer: 59

## 2017-03-13 ENCOUNTER — Encounter (HOSPITAL_COMMUNITY)
Admission: RE | Disposition: A | Discharge status: Home Health | Payer: Self-pay | Source: Ambulatory Visit | Attending: Orthopaedic Surgery

## 2017-03-13 ENCOUNTER — Other Ambulatory Visit: Payer: Self-pay

## 2017-03-13 ENCOUNTER — Encounter (HOSPITAL_COMMUNITY): Payer: Self-pay | Admitting: General Practice

## 2017-03-13 ENCOUNTER — Inpatient Hospital Stay (HOSPITAL_COMMUNITY)
Admission: RE | Admit: 2017-03-13 | Discharge: 2017-03-16 | DRG: 470 | Disposition: A | Discharge status: Home Health | Payer: 59 | Source: Ambulatory Visit | Attending: Orthopaedic Surgery | Admitting: Orthopaedic Surgery

## 2017-03-13 DIAGNOSIS — Z885 Allergy status to narcotic agent status: Secondary | ICD-10-CM | POA: Diagnosis not present

## 2017-03-13 DIAGNOSIS — Z419 Encounter for procedure for purposes other than remedying health state, unspecified: Secondary | ICD-10-CM

## 2017-03-13 DIAGNOSIS — Z96642 Presence of left artificial hip joint: Secondary | ICD-10-CM | POA: Diagnosis not present

## 2017-03-13 DIAGNOSIS — Z79890 Hormone replacement therapy (postmenopausal): Secondary | ICD-10-CM

## 2017-03-13 DIAGNOSIS — M25552 Pain in left hip: Secondary | ICD-10-CM | POA: Diagnosis not present

## 2017-03-13 DIAGNOSIS — Z96698 Presence of other orthopedic joint implants: Secondary | ICD-10-CM | POA: Diagnosis present

## 2017-03-13 DIAGNOSIS — M21379 Foot drop, unspecified foot: Secondary | ICD-10-CM

## 2017-03-13 DIAGNOSIS — M1612 Unilateral primary osteoarthritis, left hip: Principal | ICD-10-CM | POA: Diagnosis present

## 2017-03-13 DIAGNOSIS — Z90710 Acquired absence of both cervix and uterus: Secondary | ICD-10-CM | POA: Diagnosis not present

## 2017-03-13 DIAGNOSIS — Z881 Allergy status to other antibiotic agents status: Secondary | ICD-10-CM

## 2017-03-13 DIAGNOSIS — Z888 Allergy status to other drugs, medicaments and biological substances status: Secondary | ICD-10-CM | POA: Diagnosis not present

## 2017-03-13 DIAGNOSIS — Z471 Aftercare following joint replacement surgery: Secondary | ICD-10-CM | POA: Diagnosis not present

## 2017-03-13 HISTORY — PX: TOTAL HIP ARTHROPLASTY: SHX124

## 2017-03-13 SURGERY — ARTHROPLASTY, HIP, TOTAL, ANTERIOR APPROACH
Anesthesia: Monitor Anesthesia Care | Laterality: Left

## 2017-03-13 MED ORDER — BUPIVACAINE LIPOSOME 1.3 % IJ SUSP
20.0000 mL | Freq: Once | INTRAMUSCULAR | Status: DC
  Filled 2017-03-13: qty 20

## 2017-03-13 MED ORDER — DIPHENHYDRAMINE HCL 12.5 MG/5ML PO ELIX: 12.5000 mg | ORAL_SOLUTION | ORAL | Status: DC | PRN

## 2017-03-13 MED ORDER — BISACODYL 5 MG PO TBEC
5.0000 mg | DELAYED_RELEASE_TABLET | Freq: Every day | ORAL | Status: DC | PRN
  Administered 2017-03-15: 5 mg via ORAL
  Filled 2017-03-13: qty 1

## 2017-03-13 MED ORDER — ACETAMINOPHEN 650 MG RE SUPP: 650.0000 mg | RECTAL | Status: DC | PRN

## 2017-03-13 MED ORDER — ACETAMINOPHEN 325 MG PO TABS: 650.0000 mg | ORAL_TABLET | ORAL | Status: DC | PRN

## 2017-03-13 MED ORDER — METOCLOPRAMIDE HCL 5 MG PO TABS: 5.0000 mg | ORAL_TABLET | Freq: Three times a day (TID) | ORAL | Status: DC | PRN

## 2017-03-13 MED ORDER — CHLORHEXIDINE GLUCONATE 4 % EX LIQD: 60.0000 mL | Freq: Once | CUTANEOUS | Status: DC

## 2017-03-13 MED ORDER — ONDANSETRON HCL 4 MG/2ML IJ SOLN
INTRAMUSCULAR | Status: AC
  Filled 2017-03-13: qty 2

## 2017-03-13 MED ORDER — DEXAMETHASONE SODIUM PHOSPHATE 10 MG/ML IJ SOLN
INTRAMUSCULAR | Status: DC | PRN
  Administered 2017-03-13: 10 mg via INTRAVENOUS

## 2017-03-13 MED ORDER — BUPIVACAINE HCL (PF) 0.25 % IJ SOLN
INTRAMUSCULAR | Status: DC | PRN
  Administered 2017-03-13: 30 mL

## 2017-03-13 MED ORDER — BUPIVACAINE LIPOSOME 1.3 % IJ SUSP
INTRAMUSCULAR | Status: DC | PRN
  Administered 2017-03-13: 20 mL

## 2017-03-13 MED ORDER — ONDANSETRON HCL 4 MG PO TABS: 4.0000 mg | ORAL_TABLET | Freq: Four times a day (QID) | ORAL | Status: DC | PRN

## 2017-03-13 MED ORDER — EPINEPHRINE PF 1 MG/ML IJ SOLN
INTRAMUSCULAR | Status: DC | PRN
  Administered 2017-03-13: 1 mg

## 2017-03-13 MED ORDER — VANCOMYCIN HCL IN DEXTROSE 1-5 GM/200ML-% IV SOLN
1000.0000 mg | Freq: Two times a day (BID) | INTRAVENOUS | Status: AC
  Administered 2017-03-13: 1000 mg via INTRAVENOUS
  Filled 2017-03-13: qty 200

## 2017-03-13 MED ORDER — ALUM & MAG HYDROXIDE-SIMETH 200-200-20 MG/5ML PO SUSP: 30.0000 mL | ORAL | Status: DC | PRN

## 2017-03-13 MED ORDER — TRANEXAMIC ACID 1000 MG/10ML IV SOLN
1000.0000 mg | Freq: Once | INTRAVENOUS | Status: AC
  Administered 2017-03-13: 1000 mg via INTRAVENOUS
  Filled 2017-03-13: qty 10

## 2017-03-13 MED ORDER — HYDROMORPHONE HCL 1 MG/ML IJ SOLN
INTRAMUSCULAR | Status: AC
  Administered 2017-03-13: 0.5 mg via INTRAVENOUS
  Filled 2017-03-13: qty 1

## 2017-03-13 MED ORDER — 0.9 % SODIUM CHLORIDE (POUR BTL) OPTIME
TOPICAL | Status: DC | PRN
  Administered 2017-03-13: 1000 mL

## 2017-03-13 MED ORDER — PHENOL 1.4 % MT LIQD: 1.0000 | OROMUCOSAL | Status: DC | PRN

## 2017-03-13 MED ORDER — VANCOMYCIN HCL IN DEXTROSE 1-5 GM/200ML-% IV SOLN
1000.0000 mg | INTRAVENOUS | Status: AC
  Administered 2017-03-13: 1000 mg via INTRAVENOUS

## 2017-03-13 MED ORDER — MENTHOL 3 MG MT LOZG: 1.0000 | LOZENGE | OROMUCOSAL | Status: DC | PRN

## 2017-03-13 MED ORDER — HYDROCODONE-ACETAMINOPHEN 5-325 MG PO TABS
2.0000 | ORAL_TABLET | ORAL | Status: DC | PRN
  Administered 2017-03-13 – 2017-03-16 (×9): 2 via ORAL
  Filled 2017-03-13 (×11): qty 2

## 2017-03-13 MED ORDER — VANCOMYCIN HCL IN DEXTROSE 1-5 GM/200ML-% IV SOLN
INTRAVENOUS | Status: AC
  Filled 2017-03-13: qty 200

## 2017-03-13 MED ORDER — PROPOFOL 10 MG/ML IV BOLUS
INTRAVENOUS | Status: AC
  Filled 2017-03-13: qty 40

## 2017-03-13 MED ORDER — MIDAZOLAM HCL 2 MG/2ML IJ SOLN
INTRAMUSCULAR | Status: AC
  Filled 2017-03-13: qty 2

## 2017-03-13 MED ORDER — HYDROCODONE-ACETAMINOPHEN 5-325 MG PO TABS
ORAL_TABLET | ORAL | Status: AC
  Administered 2017-03-13: 1 via ORAL
  Filled 2017-03-13: qty 1

## 2017-03-13 MED ORDER — DOCUSATE SODIUM 100 MG PO CAPS
100.0000 mg | ORAL_CAPSULE | Freq: Two times a day (BID) | ORAL | Status: DC
  Administered 2017-03-13 – 2017-03-16 (×6): 100 mg via ORAL
  Filled 2017-03-13 (×5): qty 1

## 2017-03-13 MED ORDER — LACTATED RINGERS IV SOLN
INTRAVENOUS | Status: DC
  Administered 2017-03-13: 07:00:00 via INTRAVENOUS

## 2017-03-13 MED ORDER — HYDROCODONE-ACETAMINOPHEN 5-325 MG PO TABS
1.0000 | ORAL_TABLET | ORAL | Status: DC | PRN
  Administered 2017-03-13 – 2017-03-14 (×2): 1 via ORAL
  Filled 2017-03-13: qty 1

## 2017-03-13 MED ORDER — EPHEDRINE 5 MG/ML INJ
INTRAVENOUS | Status: AC
  Filled 2017-03-13: qty 10

## 2017-03-13 MED ORDER — METHOCARBAMOL 500 MG PO TABS
ORAL_TABLET | ORAL | Status: AC
  Administered 2017-03-13: 500 mg via ORAL
  Filled 2017-03-13: qty 1

## 2017-03-13 MED ORDER — METOCLOPRAMIDE HCL 5 MG/ML IJ SOLN: 5.0000 mg | Freq: Three times a day (TID) | INTRAMUSCULAR | Status: DC | PRN

## 2017-03-13 MED ORDER — BUPIVACAINE HCL (PF) 0.25 % IJ SOLN
INTRAMUSCULAR | Status: AC
  Filled 2017-03-13: qty 30

## 2017-03-13 MED ORDER — HYDROMORPHONE HCL 1 MG/ML IJ SOLN
0.2500 mg | INTRAMUSCULAR | Status: DC | PRN
  Administered 2017-03-13 (×3): 0.5 mg via INTRAVENOUS

## 2017-03-13 MED ORDER — ONDANSETRON HCL 4 MG/2ML IJ SOLN
4.0000 mg | Freq: Four times a day (QID) | INTRAMUSCULAR | Status: DC | PRN
  Administered 2017-03-13 (×2): 4 mg via INTRAVENOUS
  Filled 2017-03-13 (×2): qty 2

## 2017-03-13 MED ORDER — MIDAZOLAM HCL 5 MG/5ML IJ SOLN
INTRAMUSCULAR | Status: DC | PRN
  Administered 2017-03-13 (×4): 1 mg via INTRAVENOUS

## 2017-03-13 MED ORDER — METHOCARBAMOL 500 MG PO TABS
500.0000 mg | ORAL_TABLET | Freq: Four times a day (QID) | ORAL | Status: DC | PRN
  Administered 2017-03-13 – 2017-03-15 (×4): 500 mg via ORAL
  Filled 2017-03-13 (×4): qty 1

## 2017-03-13 MED ORDER — DEXAMETHASONE SODIUM PHOSPHATE 10 MG/ML IJ SOLN
INTRAMUSCULAR | Status: AC
  Filled 2017-03-13: qty 1

## 2017-03-13 MED ORDER — ASPIRIN EC 325 MG PO TBEC
325.0000 mg | DELAYED_RELEASE_TABLET | Freq: Two times a day (BID) | ORAL | Status: DC
  Administered 2017-03-14 – 2017-03-16 (×5): 325 mg via ORAL
  Filled 2017-03-13 (×5): qty 1

## 2017-03-13 MED ORDER — FENTANYL CITRATE (PF) 100 MCG/2ML IJ SOLN
INTRAMUSCULAR | Status: DC | PRN
  Administered 2017-03-13 (×2): 50 ug via INTRAVENOUS

## 2017-03-13 MED ORDER — BUPIVACAINE IN DEXTROSE 0.75-8.25 % IT SOLN
INTRATHECAL | Status: DC | PRN
  Administered 2017-03-13: 12 mg via INTRATHECAL

## 2017-03-13 MED ORDER — PHENYLEPHRINE 40 MCG/ML (10ML) SYRINGE FOR IV PUSH (FOR BLOOD PRESSURE SUPPORT)
PREFILLED_SYRINGE | INTRAVENOUS | Status: DC | PRN
  Administered 2017-03-13 (×4): 80 ug via INTRAVENOUS

## 2017-03-13 MED ORDER — PROMETHAZINE HCL 25 MG PO TABS: 12.5000 mg | ORAL_TABLET | Freq: Four times a day (QID) | ORAL | Status: DC | PRN

## 2017-03-13 MED ORDER — PHENYLEPHRINE 40 MCG/ML (10ML) SYRINGE FOR IV PUSH (FOR BLOOD PRESSURE SUPPORT)
PREFILLED_SYRINGE | INTRAVENOUS | Status: AC
  Filled 2017-03-13: qty 30

## 2017-03-13 MED ORDER — PROMETHAZINE HCL 25 MG/ML IJ SOLN: 6.2500 mg | INTRAMUSCULAR | Status: DC | PRN

## 2017-03-13 MED ORDER — FENTANYL CITRATE (PF) 250 MCG/5ML IJ SOLN
INTRAMUSCULAR | Status: AC
  Filled 2017-03-13: qty 5

## 2017-03-13 MED ORDER — METHOCARBAMOL 1000 MG/10ML IJ SOLN: 500.0000 mg | Freq: Four times a day (QID) | INTRAVENOUS | Status: DC | PRN

## 2017-03-13 MED ORDER — HYDROMORPHONE HCL 1 MG/ML IJ SOLN
0.5000 mg | INTRAMUSCULAR | Status: DC | PRN
  Administered 2017-03-13 (×2): 1 mg via INTRAVENOUS
  Filled 2017-03-13 (×2): qty 1

## 2017-03-13 MED ORDER — PROPOFOL 500 MG/50ML IV EMUL
INTRAVENOUS | Status: DC | PRN
  Administered 2017-03-13: 25 ug/kg/min via INTRAVENOUS

## 2017-03-13 MED ORDER — EPINEPHRINE PF 1 MG/ML IJ SOLN
INTRAMUSCULAR | Status: AC
  Filled 2017-03-13: qty 1

## 2017-03-13 MED ORDER — ONDANSETRON HCL 4 MG/2ML IJ SOLN
INTRAMUSCULAR | Status: DC | PRN
  Administered 2017-03-13: 4 mg via INTRAVENOUS

## 2017-03-13 MED ORDER — LEVOTHYROXINE SODIUM 75 MCG PO TABS
75.0000 ug | ORAL_TABLET | Freq: Every day | ORAL | Status: DC
  Administered 2017-03-14 – 2017-03-16 (×3): 75 ug via ORAL
  Filled 2017-03-13 (×3): qty 1

## 2017-03-13 MED ORDER — EPHEDRINE SULFATE-NACL 50-0.9 MG/10ML-% IV SOSY
PREFILLED_SYRINGE | INTRAVENOUS | Status: DC | PRN
  Administered 2017-03-13: 5 mg via INTRAVENOUS

## 2017-03-13 MED ORDER — LACTATED RINGERS IV SOLN: INTRAVENOUS | Status: DC

## 2017-03-13 SURGICAL SUPPLY — 40 items
BLADE SAW SGTL 18X1.27X75 (BLADE) ×2 IMPLANT
BLADE SAW SGTL 18X1.27X75MM (BLADE) ×1
CAPT HIP TOTAL 2 ×3 IMPLANT
CELLS DAT CNTRL 66122 CELL SVR (MISCELLANEOUS) ×1 IMPLANT
COVER PERINEAL POST (MISCELLANEOUS) ×3 IMPLANT
COVER SURGICAL LIGHT HANDLE (MISCELLANEOUS) ×3 IMPLANT
DRAPE C-ARM 42X72 X-RAY (DRAPES) ×3 IMPLANT
DRAPE STERI IOBAN 125X83 (DRAPES) ×3 IMPLANT
DRAPE U-SHAPE 47X51 STRL (DRAPES) ×9 IMPLANT
DRAPE UNIVERSAL PACK (DRAPES) ×3 IMPLANT
DRSG AQUACEL AG ADV 3.5X10 (GAUZE/BANDAGES/DRESSINGS) ×3 IMPLANT
DURAPREP 26ML APPLICATOR (WOUND CARE) ×3 IMPLANT
ELECT REM PT RETURN 9FT ADLT (ELECTROSURGICAL) ×3
ELECTRODE REM PT RTRN 9FT ADLT (ELECTROSURGICAL) ×1 IMPLANT
FACESHIELD WRAPAROUND (MASK) ×6 IMPLANT
GLOVE BIO SURGEON STRL SZ8 (GLOVE) ×6 IMPLANT
GLOVE BIOGEL PI IND STRL 8 (GLOVE) ×2 IMPLANT
GLOVE BIOGEL PI INDICATOR 8 (GLOVE) ×4
GOWN STRL REUS W/ TWL LRG LVL3 (GOWN DISPOSABLE) ×1 IMPLANT
GOWN STRL REUS W/ TWL XL LVL3 (GOWN DISPOSABLE) ×2 IMPLANT
GOWN STRL REUS W/TWL LRG LVL3 (GOWN DISPOSABLE) ×2
GOWN STRL REUS W/TWL XL LVL3 (GOWN DISPOSABLE) ×4
KIT BASIN OR (CUSTOM PROCEDURE TRAY) ×3 IMPLANT
KIT ROOM TURNOVER OR (KITS) ×3 IMPLANT
MANIFOLD NEPTUNE II (INSTRUMENTS) ×3 IMPLANT
NEEDLE ECHOTIP HI DEF 22GA (NEEDLE) IMPLANT
NEEDLE HYPO 22GX1.5 SAFETY (NEEDLE) ×3 IMPLANT
NS IRRIG 1000ML POUR BTL (IV SOLUTION) ×3 IMPLANT
PACK TOTAL JOINT (CUSTOM PROCEDURE TRAY) ×3 IMPLANT
PAD ARMBOARD 7.5X6 YLW CONV (MISCELLANEOUS) ×3 IMPLANT
RTRCTR WOUND ALEXIS 18CM MED (MISCELLANEOUS) ×3
SUT ETHIBOND NAB CT1 #1 30IN (SUTURE) ×6 IMPLANT
SUT VIC AB 1 CT1 27 (SUTURE) ×2
SUT VIC AB 1 CT1 27XBRD ANBCTR (SUTURE) ×1 IMPLANT
SUT VIC AB 2-0 CT1 27 (SUTURE) ×2
SUT VIC AB 2-0 CT1 TAPERPNT 27 (SUTURE) ×1 IMPLANT
SUT VIC AB 3-0 PS2 18 (SUTURE) ×2
SUT VIC AB 3-0 PS2 18XBRD (SUTURE) ×1 IMPLANT
SUT VLOC 180 0 24IN GS25 (SUTURE) ×3 IMPLANT
SYR 50ML LL SCALE MARK (SYRINGE) ×3 IMPLANT

## 2017-03-13 NOTE — Interval H&P Note (Signed)
History and Physical Interval Note:  03/13/2017 7:21 AM  Brandi Mitchell  has presented today for surgery, with the diagnosis of LEFT HIP DEGENERATIVE JOINT DISEASE  The various methods of treatment have been discussed with the patient and family. After consideration of risks, benefits and other options for treatment, the patient has consented to  Procedure(s): TOTAL HIP ARTHROPLASTY ANTERIOR APPROACH (Left) as a surgical intervention .  The patient's history has been reviewed, patient examined, no change in status, stable for surgery.  I have reviewed the patient's chart and labs.  Questions were answered to the patient's satisfaction.     Prentiss Hammett G

## 2017-03-13 NOTE — Progress Notes (Signed)
Orthopedic Tech Progress Note Patient Details:  Brandi Mitchell 19-Jun-1960 [id number] OHF Applied  Patient ID: Brandi Mitchell, female   DOB: September 29, 1960, 57 y.o.   MRN: [id number]   Brandi Mitchell 03/13/2017, 5:18 PM

## 2017-03-13 NOTE — Transfer of Care (Signed)
Immediate Anesthesia Transfer of Care Note  Patient: Brandi Mitchell  Procedure(s) Performed: TOTAL HIP ARTHROPLASTY ANTERIOR APPROACH (Left )  Patient Location: PACU  Anesthesia Type:MAC and Spinal  Level of Consciousness: awake, alert , oriented and sedated  Airway & Oxygen Therapy: Patient Spontanous Breathing and Patient connected to nasal cannula oxygen  Post-op Assessment: Report given to RN, Post -op Vital signs reviewed and stable and Patient moving all extremities  Post vital signs: Reviewed and stable  Last Vitals:  Vitals:   03/13/17 0625 03/13/17 0926  BP: (!) 151/75 (!) (P) 101/58  Pulse: (!) 59   Resp: 18   Temp: 36.9 C (!) (P) 36.2 C  SpO2: 99%     Last Pain:  Vitals:   03/13/17 0625  TempSrc: Oral  PainSc: 5       Patients Stated Pain Goal: 4 (27/78/24 2353)  Complications: No apparent anesthesia complications

## 2017-03-13 NOTE — Anesthesia Procedure Notes (Signed)
Spinal  Patient location during procedure: OR Start time: 03/13/2017 7:36 AM End time: 03/13/2017 7:48 AM Staffing Anesthesiologist: Duane Boston, MD Performed: anesthesiologist  Preanesthetic Checklist Completed: patient identified, surgical consent, pre-op evaluation, timeout performed, IV checked, risks and benefits discussed and monitors and equipment checked Spinal Block Patient position: sitting Prep: DuraPrep Patient monitoring: cardiac monitor, continuous pulse ox and blood pressure Approach: midline Location: L2-3 Injection technique: single-shot Needle Needle type: Pencan  Needle gauge: 24 G Needle length: 9 cm Additional Notes Functioning IV was confirmed and monitors were applied. Sterile prep and drape, including hand hygiene and sterile gloves were used. The patient was positioned and the spine was prepped. The skin was anesthetized with lidocaine.  Free flow of clear CSF was obtained prior to injecting local anesthetic into the CSF.  The spinal needle aspirated freely following injection.  The needle was carefully withdrawn.  The patient tolerated the procedure well.

## 2017-03-13 NOTE — Anesthesia Postprocedure Evaluation (Signed)
Anesthesia Post Note  Patient: Brandi Mitchell  Procedure(s) Performed: TOTAL HIP ARTHROPLASTY ANTERIOR APPROACH (Left )     Patient location during evaluation: PACU Anesthesia Type: MAC and Spinal Level of consciousness: awake and alert Pain management: pain level controlled Vital Signs Assessment: post-procedure vital signs reviewed and stable Respiratory status: spontaneous breathing and respiratory function stable Cardiovascular status: blood pressure returned to baseline and stable Postop Assessment: spinal receding Anesthetic complications: no    Last Vitals:  Vitals:   03/13/17 1045 03/13/17 1100  BP: 118/65 125/66  Pulse: (!) 58 66  Resp: 14 10  Temp:    SpO2: 98% 100%    Last Pain:  Vitals:   03/13/17 1100  TempSrc:   PainSc: 6                  [name]

## 2017-03-13 NOTE — Op Note (Signed)
PRE-OP DIAGNOSIS:  LEFT HIP DEGENERATIVE JOINT DISEASE POST-OP DIAGNOSIS: same PROCEDURE:  LEFT TOTAL HIP ARTHROPLASTY ANTERIOR APPROACH ANESTHESIA:  Spinal and MAC SURGEON:  Melrose Nakayama MD ASSISTANT:  Loni Dolly PA-C   INDICATIONS FOR PROCEDURE:  The patient is a 57 y.o. female with a long history of a painful hip.  This has persisted despite multiple conservative measures.  The patient has persisted with pain and dysfunction making rest and activity difficult.  A total hip replacement is offered as surgical treatment.  Informed operative consent was obtained after discussion of possible complications including reaction to anesthesia, infection, neurovascular injury, dislocation, DVT, PE, and death.  The importance of the postoperative rehab program to optimize result was stressed with the patient.  SUMMARY OF FINDINGS AND PROCEDURE:  Under general anesthesia through a anterior approach an the Hana table a left THR was performed.  The patient had severe degenerative change and good bone quality.  We used DePuy components to replace the hip and these were size KA12 Corail femur capped with a +1 31m ceramic hip ball.  On the acetabular side we used a size 48 Gription shell with a plus 0 neutral polyethylene liner.  We did use a hole eliminator.  ALoni DollyPA-C assisted throughout and was invaluable to the completion of the case in that he helped position and retract while I performed the procedure.  He also closed simultaneously to help minimize OR time.  I used fluoroscopy throughout the case to check position of components and leg lengths and read all these views myself.  DESCRIPTION OF PROCEDURE:  The patient was taken to the OR suite where general anesthetic was applied.  The patient was then positioned on the Hana table supine.  All bony prominences were appropriately padded.  Prep and drape was then performed in normal sterile fashion.  The patient was given vancomycin preoperative antibiotic  and an appropriate time out was performed.  We then took an anterior approach to the left hip.  Dissection was taken through adipose to the tensor fascia lata fascia.  This structure was incised longitudinally and we dissected in the intermuscular interval just medial to this muscle.  Cobra retractors were placed superior and inferior to the femoral neck superficial to the capsule.  A capsular incision was then made and the retractors were placed along the femoral neck.  Xray was brought in to get a good level for the femoral neck cut which was made with an oscillating saw and osteotome.  The femoral head was removed with a corkscrew.  The acetabulum was exposed and some labral tissues were excised. Reaming was taken to the inside wall of the pelvis and sequentially up to 1 mm smaller than the actual component.  A trial of components was done and then the aforementioned acetabular shell was placed in appropriate tilt and anteversion confirmed by fluoroscopy. The liner was placed along with the hole eliminator and attention was turned to the femur.  The leg was brought down and over into adduction and the elevator bar was used to raise the femur up gently in the wound.  The piriformis was released with care taken to preserve the obturator internus attachment and all of the posterior capsule. The femur was reamed and then broached to the appropriate size.  A trial reduction was done and the aforementioned head and neck assembly gave uKoreathe best stability in extension with external rotation.  Leg lengths were felt to be about equal by fluoroscopic exam.  The trial components were removed and the wound irrigated.  We then placed the femoral component in appropriate anteversion.  The head was applied to a dry stem neck and the hip again reduced.  It was again stable in the aforementioned position.  The would was irrigated again followed by re-approximation of anterior capsule with ethibond suture. Tensor fascia was  repaired with V-loc suture  followed by deep closure with #O and #2 undyed vicryl.  Skin was closed with subQ stitch and steristrips followed by a sterile dressing.  EBL and IOF can be obtained from anesthesia records.  DISPOSITION:  The patient was extubated in the OR and taken to PACU in stable condition to be admitted to the Orthopedic Surgery for appropriate post-op care to include perioperative antibiotics and DVT prophylaxis.

## 2017-03-13 NOTE — Progress Notes (Signed)
reportt given to taylor rn as cAregiver

## 2017-03-13 NOTE — Anesthesia Preprocedure Evaluation (Addendum)
Anesthesia Evaluation  Patient identified by MRN, date of birth, ID band Patient awake    Reviewed: Allergy & Precautions, NPO status , Patient's Chart, lab work & pertinent test results  History of Anesthesia Complications Negative for: history of anesthetic complications  Airway Mallampati: II  TM Distance: >3 FB Neck ROM: Full    Dental no notable dental hx. (+) Dental Advisory Given   Pulmonary neg pulmonary ROS,    Pulmonary exam normal        Cardiovascular negative cardio ROS Normal cardiovascular exam     Neuro/Psych  Headaches, negative psych ROS   GI/Hepatic negative GI ROS, Neg liver ROS,   Endo/Other  negative endocrine ROS  Renal/GU negative Renal ROS     Musculoskeletal  (+) Arthritis ,   Abdominal   Peds  Hematology negative hematology ROS (+)   Anesthesia Other Findings Day of surgery medications reviewed with the patient.  Reproductive/Obstetrics                            Anesthesia Physical Anesthesia Plan  ASA: I  Anesthesia Plan: MAC and Spinal   Post-op Pain Management:    Induction:   PONV Risk Score and Plan: 2 and Ondansetron and Propofol infusion  Airway Management Planned: Natural Airway  Additional Equipment:   Intra-op Plan:   Post-operative Plan:   Informed Consent: I have reviewed the patients History and Physical, chart, labs and discussed the procedure including the risks, benefits and alternatives for the proposed anesthesia with the patient or authorized representative who has indicated his/her understanding and acceptance.   Dental advisory given  Plan Discussed with: CRNA, Anesthesiologist and Surgeon  Anesthesia Plan Comments:        Anesthesia Quick Evaluation

## 2017-03-13 NOTE — Progress Notes (Signed)
PT Cancellation Note  Patient Details Name: Brandi Mitchell MRN: [id number] DOB: October 31, 1960   Cancelled Treatment:    Reason Eval/Treat Not Completed: Other (comment) Pt's nerve block still active and unable to perform AROM in LLE. Will hold until block wears off and will follow up as schedule allows.   Leighton Ruff, PT, DPT  Acute Rehabilitation Services  Pager: [phone]    [name] 03/13/2017, 4:24 PM

## 2017-03-13 NOTE — Plan of Care (Signed)
  Coping: Level of anxiety will decrease 03/13/2017 1832 - Progressing by Williams Che, RN   Elimination: Will not experience complications related to bowel motility 03/13/2017 1832 - Progressing by Williams Che, RN   Pain Managment: General experience of comfort will improve 03/13/2017 1832 - Progressing by Williams Che, RN   Safety: Ability to remain free from injury will improve 03/13/2017 1832 - Progressing by Williams Che, RN

## 2017-03-13 NOTE — Evaluation (Signed)
Physical Therapy Evaluation Patient Details Name: Brandi Mitchell MRN: [id number] DOB: 28-Jan-1961 Today's Date: 03/13/2017   History of Present Illness  Pt is a 57 y/o female s/p L THA, direct anterior approach. PMH inlcudes heart murmur.   Clinical Impression  Pt s/p surgery above with deficits below. Pt very limited by pain and nausea this session and only able to tolerate stand pivot X2. Pt also presenting with post op weakness and decreased sensation and required min A for mobility. Will benefit from acute PT to maximize functional mobility independence and safety.     Follow Up Recommendations DC plan and follow up therapy as arranged by surgeon;Supervision for mobility/OOB    Equipment Recommendations  3in1 (PT)    Recommendations for Other Services       Precautions / Restrictions Precautions Precautions: Fall Restrictions Weight Bearing Restrictions: Yes LLE Weight Bearing: Weight bearing as tolerated      Mobility  Bed Mobility Overal bed mobility: Needs Assistance Bed Mobility: Supine to Sit;Sit to Supine     Supine to sit: Mod assist Sit to supine: Mod assist   General bed mobility comments: Mod A for LLE management and for trunk elevation to come up to sitting. Pt very guarded secondary to pain. Mod A for BLE assist to return back to supine as pt very painful.   Transfers Overall transfer level: Needs assistance Equipment used: Rolling walker (2 wheeled) Transfers: Sit to/from [facility] Sit to Stand: Min assist Stand pivot transfers: Min assist       General transfer comment: Min A for lift assist and steadying. Performed stand pivot transfer to [facility] and back to bed. Difficulty moving LLE secondary to pain and deficits from block, therefore required assist to move. Increased pain with movement as well. Pt also with nausea during transfer and required rest breaks to calm symptoms. Verbal cues for sequencing using RW.   Ambulation/Gait              General Gait Details: NT.   Stairs            Wheelchair Mobility    Modified Rankin (Stroke Patients Only)       Balance Overall balance assessment: Needs assistance Sitting-balance support: No upper extremity supported;Feet supported Sitting balance-Leahy Scale: Fair     Standing balance support: Bilateral upper extremity supported;During functional activity Standing balance-Leahy Scale: Poor Standing balance comment: Reliant on BUE support and steadying throughout mobility.                              Pertinent Vitals/Pain Pain Assessment: 0-10 Pain Score: 8  Pain Location: L hip  Pain Descriptors / Indicators: Aching;Operative site guarding Pain Intervention(s): Limited activity within patient's tolerance;Monitored during session;Repositioned    Home Living Family/patient expects to be discharged to:: Private residence Living Arrangements: Spouse/significant other Available Help at Discharge: Family;Available 24 hours/day Type of Home: House Home Access: Stairs to enter Entrance Stairs-Rails: None Entrance Stairs-Number of Steps: 2 Home Layout: Two level;Able to live on main level with bedroom/bathroom Home Equipment: Gilford Rile - 2 wheels;Shower seat      Prior Function Level of Independence: Independent               Hand Dominance        Extremity/Trunk Assessment   Upper Extremity Assessment Upper Extremity Assessment: Overall WFL for tasks assessed    Lower Extremity Assessment Lower Extremity Assessment: LLE deficits/detail LLE Deficits /  Details: Deficits consistent with post op pain and weakness. Slight knee buckling noted in LLE.     Cervical / Trunk Assessment Cervical / Trunk Assessment: Normal  Communication   Communication: No difficulties  Cognition Arousal/Alertness: Awake/alert Behavior During Therapy: WFL for tasks assessed/performed Overall Cognitive Status: Within Functional Limits for tasks  assessed                                        General Comments General comments (skin integrity, edema, etc.): Pt's husband present throughout.     Exercises     Assessment/Plan    PT Assessment Patient needs continued PT services  PT Problem List Decreased strength;Decreased activity tolerance;Decreased balance;Impaired sensation;Decreased mobility;Decreased knowledge of use of DME;Decreased coordination;Pain       PT Treatment Interventions DME instruction;Gait training;Stair training;Functional mobility training;Therapeutic activities;Therapeutic exercise;Balance training;Neuromuscular re-education;Patient/family education    PT Goals (Current goals can be found in the Care Plan section)  Acute Rehab PT Goals Patient Stated Goal: to decrease pain  PT Goal Formulation: With patient Time For Goal Achievement: 03/27/17 Potential to Achieve Goals: Good    Frequency 7X/week   Barriers to discharge        Co-evaluation               AM-PAC PT "6 Clicks" Daily Activity  Outcome Measure Difficulty turning over in bed (including adjusting bedclothes, sheets and blankets)?: A Little Difficulty moving from lying on back to sitting on the side of the bed? : Unable Difficulty sitting down on and standing up from a chair with arms (e.g., wheelchair, bedside commode, etc,.)?: Unable Help needed moving to and from a bed to chair (including a wheelchair)?: A Little Help needed walking in hospital room?: A Lot Help needed climbing 3-5 steps with a railing? : Total 6 Click Score: 11    End of Session Equipment Utilized During Treatment: Gait belt Activity Tolerance: Patient limited by pain;Treatment limited secondary to medical complications (Comment)(nausea ) Patient left: in bed;with call bell/phone within reach;with family/visitor present Nurse Communication: Mobility status;Other (comment)(nausea ) PT Visit Diagnosis: Other abnormalities of gait and  mobility (R26.89);Unsteadiness on feet (R26.81);Pain Pain - Right/Left: Left Pain - part of body: Hip    Time: 2297-9892 PT Time Calculation (min) (ACUTE ONLY): 41 min   Charges:   PT Evaluation $PT Eval Moderate Complexity: 1 Mod PT Treatments $Therapeutic Activity: 23-37 mins   PT G Codes:        Leighton Ruff, PT, DPT  Acute Rehabilitation Services  Pager: [phone]   [name] 03/13/2017, 6:40 PM

## 2017-03-14 ENCOUNTER — Inpatient Hospital Stay (HOSPITAL_COMMUNITY): Payer: 59

## 2017-03-14 ENCOUNTER — Encounter (HOSPITAL_COMMUNITY): Payer: Self-pay | Admitting: Orthopaedic Surgery

## 2017-03-14 NOTE — Progress Notes (Signed)
Physical Therapy Treatment Patient Details Name: Brandi Mitchell MRN: [id number] DOB: [birth year] Today's Date: 03/14/2017    History of Present Illness Pt is a 56 y/o female s/p L THA, direct anterior approach. PMH inlcudes heart murmur.     PT Comments    Continuing work on functional mobility and activity tolerance;  Worked on compensatory strategies to help with LLE advancement including more weight shift R, use of L abdominals to initiate swing, and even turning yellow hospital sock inside out to decr friction; we had some success with these strategies and she didn't need physical assist for LLE stepping towards the end of our walk; It is also noteworthy that she does not have any knee buckling or obvious quad weakness in stance LLE;   Took extra time to work on positioning in the bed to decr foot drop; Worth considering a PRAFO for Brandi Mitchell to wear in the bed and at rest to preserve ankle dorsiflexion range, and it may help with comfort; Left voicemail at Dr. Jerald Kief office to consider [facility], and discussed it with pt's nurse, Brandi Comment, RN;   Noting improvements, but they are slow; I anticipate she'll need 2 sessions tomorrow, and believe it is worth considering dc Friday, depending on progress tomorrow.    Follow Up Recommendations  DC plan and follow up therapy as arranged by surgeon;Other (Mitchell)(Strongly recommend Outpt PT follow up post HHPT course to monitor for ankle AROM/dorsiflexion return)     Equipment Recommendations  3in1 (PT);Other (Mitchell)(Consider PRAFO for dorsiflexion preservation)    Recommendations for Other Services OT consult(ordered per protocol)     Precautions / Restrictions Precautions Precautions: Fall Restrictions LLE Weight Bearing: Weight bearing as tolerated    Mobility  Bed Mobility Overal bed mobility: Needs Assistance Bed Mobility: Sit to Supine       Sit to supine: Mod assist   General bed mobility comments: Mod assist to support LLE  while coming onto bed; slow moving secondary to pain  Transfers Overall transfer level: Needs assistance Equipment used: Rolling walker (2 wheeled) Transfers: Sit to/from Stand Sit to Stand: Min guard         General transfer Mitchell: [place]ingu[place] for safety; improving power up to stand  Ambulation/Gait Ambulation/Gait assistance: Min assist Ambulation Distance (Feet): 30 Feet Assistive device: Rolling walker (2 wheeled) Gait Pattern/deviations: Decreased step length - left;Decreased dorsiflexion - left Gait velocity: extremely slow   General Gait Details: Needing assist for LLE advancement due to decr dorsiflexion L ankle and pain with hip flexion; Cues for gait sequence and RW use; very nice support of self on RW for R foot advancement; used weight shift to R and other compensatory strategies to help with LLE advancement   Stairs            Wheelchair Mobility    Modified Rankin (Stroke Patients Only)       Balance     Sitting balance-Leahy Scale: Fair       Standing balance-Leahy Scale: (approaching Fair)                              Cognition Arousal/Alertness: Awake/alert Behavior During Therapy: WFL for tasks assessed/performed Overall Cognitive Status: Within Functional Limits for tasks assessed  Exercises      General Comments General comments (skin integrity, edema, etc.): Husband present throughout      Pertinent Vitals/Pain Pain Assessment: Faces Faces Pain Scale: Hurts even more Pain Location: L hip  Pain Descriptors / Indicators: Aching;Operative site guarding Pain Intervention(s): Monitored during session;Premedicated before session;Repositioned    Home Living                      Prior Function            PT Goals (current goals can now be found in the care plan section) Acute Rehab PT Goals Patient Stated Goal: to decrease pain; she and her husband enjoy  spontaneous trips PT Goal Formulation: With patient Time For Goal Achievement: 03/27/17 Potential to Achieve Goals: Good Progress towards PT goals: Progressing toward goals    Frequency    7X/week      PT Plan Current plan remains appropriate    Co-evaluation              AM-PAC PT "6 Clicks" Daily Activity  Outcome Measure  Difficulty turning over in bed (including adjusting bedclothes, sheets and blankets)?: A Lot Difficulty moving from lying on back to sitting on the side of the bed? : A Lot Difficulty sitting down on and standing up from a chair with arms (e.g., wheelchair, bedside commode, etc,.)?: A Little Help needed moving to and from a bed to chair (including a wheelchair)?: A Little Help needed walking in hospital room?: A Lot Help needed climbing 3-5 steps with a railing? : A Lot 6 Click Score: 14    End of Session   Activity Tolerance: Patient tolerated treatment well Patient left: in bed;with call bell/phone within reach;with family/visitor present Nurse Communication: Mobility status;Other (Mitchell)(Positioning strategies for L foot; maybe PRAFO?) PT Visit Diagnosis: Other abnormalities of gait and mobility (R26.89);Unsteadiness on feet (R26.81);Pain Pain - Right/Left: Left Pain - part of body: Hip     Time: 0923-3007 PT Time Calculation (min) (ACUTE ONLY): 45 min  Charges:  $Gait Training: 23-37 mins $Therapeutic Activity: 8-22 mins                    G Codes:       Roney Marion, PT  Acute Rehabilitation Services Pager [phone] Office [phone]    [name] 03/14/2017, 4:34 PM

## 2017-03-14 NOTE — Progress Notes (Signed)
Subjective: 1 Day Post-Op Procedure(s) (LRB): TOTAL HIP ARTHROPLASTY ANTERIOR APPROACH (Left)  Activity level:  wbat Diet tolerance:  ok Voiding:  ok Patient reports pain as mild and moderate.    Objective: Vital signs in last 24 hours: Temp:  [97.2 F (36.2 C)-98.5 F (36.9 C)] 98.5 F (36.9 C) (01/23 0449) Pulse Rate:  [49-94] 81 (01/23 0449) Resp:  [9-26] 20 (01/23 0449) BP: (101-151)/(57-83) 121/57 (01/23 0449) SpO2:  [91 %-100 %] 97 % (01/23 0449)  Labs: No results for input(s): HGB in the last 72 hours. No results for input(s): WBC, RBC, HCT, PLT in the last 72 hours. No results for input(s): NA, K, CL, CO2, BUN, CREATININE, GLUCOSE, CALCIUM in the last 72 hours. No results for input(s): LABPT, INR in the last 72 hours.  Physical Exam:  Neurologically intact ABD soft Neurovascular intact Sensation intact distally Intact pulses distally Dorsiflexion/Plantar flexion intact Incision: dressing C/D/I and no drainage No cellulitis present Compartment soft  Assessment/Plan:  1 Day Post-Op Procedure(s) (LRB): TOTAL HIP ARTHROPLASTY ANTERIOR APPROACH (Left) Advance diet Up with therapy D/C IV fluids Plan for discharge tomorrow Discharge home with home health if doing well and cleared by PT. Continue on ASA 325mg  BID x 2 weeks post op. Follow up in office 2 weeks post op.  Vidalia Serpas, Larwance Sachs 03/14/2017, 7:38 AM

## 2017-03-14 NOTE — Progress Notes (Signed)
Physical Therapy Treatment Patient Details Name: Brandi Mitchell MRN: [id number] DOB: 1960-12-21 Today's Date: 03/14/2017    History of Present Illness Pt is a 57 y/o female s/p L THA, direct anterior approach. PMH inlcudes heart murmur.     PT Comments    Continuing work on functional mobility and activity tolerance;  activity tolerance is improving with greater amb distance than last session, though still limited by postop nausea; L ankle dorsiflexion is notably weak as well, leading to difficulty advancing  LLE (min assist to advance LLE this session)  Once sitting, noted possible gross leg length discrepancy (L longer than R); Dr. Vernie Shanks: could you please take a closer look?   Follow Up Recommendations  DC plan and follow up therapy as arranged by surgeon;Supervision for mobility/OOB     Equipment Recommendations  3in1 (PT)    Recommendations for Other Services OT consult(ordered per protocol)     Precautions / Restrictions Precautions Precautions: Fall Restrictions LLE Weight Bearing: Weight bearing as tolerated    Mobility  Bed Mobility Overal bed mobility: Needs Assistance Bed Mobility: Supine to Sit     Supine to sit: Mod assist     General bed mobility comments: Light Mod A for LLE management and for trunk elevation to come up to sitting. Pt very guarded secondary to pain.  Transfers Overall transfer level: Needs assistance Equipment used: Rolling walker (2 wheeled) Transfers: Sit to/from Stand Sit to Stand: Min assist         General transfer comment: Min assist to power up from bed and 3in1; min assist also to steady during transition of hands to RW; cues to control descent from stand to sit  Ambulation/Gait Ambulation/Gait assistance: Min assist(second person helpful for IV pole and chair push) Ambulation Distance (Feet): 24 Feet(to and from bathroom) Assistive device: Rolling walker (2 wheeled) Gait Pattern/deviations: Decreased step length -  left;Decreased dorsiflexion - left     General Gait Details: Needing assist for LLE advancement due to decr dorsiflexion L ankle and pain with hip flexion; Cues for gait sequence and RW use; very nice support of self on RW for R foot advancement; distance improved from yesterday, but still limited by nausea   Stairs            Wheelchair Mobility    Modified Rankin (Stroke Patients Only)       Balance     Sitting balance-Leahy Scale: Fair       Standing balance-Leahy Scale: Poor                              Cognition Arousal/Alertness: Awake/alert Behavior During Therapy: WFL for tasks assessed/performed Overall Cognitive Status: Within Functional Limits for tasks assessed                                        Exercises Total Joint Exercises Ankle Circles/Pumps: AROM;Right;AAROM;Left;10 reps;Limitations Ankle Circles/Pumps Limitations: L ankle dorsiflexion notably weak; able to initiate movement, but unable to reach neutral ankle actively; PROM WNL Quad Sets: AROM;Left;10 reps Gluteal Sets: AROM;Both;10 reps Towel Squeeze: AROM;Both;10 reps(L weak and painful) Heel Slides: AAROM;Left;10 reps(Painful) Hip ABduction/ADduction: AAROM;Left;10 reps(short arc due to pain)    General Comments General comments (skin integrity, edema, etc.): Husband present throughout      Pertinent Vitals/Pain Pain Assessment: 0-10 Pain Score: 8  Pain Location: L  hip  Pain Descriptors / Indicators: Aching;Operative site guarding Pain Intervention(s): Monitored during session    Home Living                      Prior Function            PT Goals (current goals can now be found in the care plan section) Acute Rehab PT Goals Patient Stated Goal: to decrease pain; she and her husband enjoy spontaneous trips PT Goal Formulation: With patient Time For Goal Achievement: 03/27/17 Potential to Achieve Goals: Good Progress towards PT goals:  Progressing toward goals    Frequency    7X/week      PT Plan Current plan remains appropriate    Co-evaluation              AM-PAC PT "6 Clicks" Daily Activity  Outcome Measure  Difficulty turning over in bed (including adjusting bedclothes, sheets and blankets)?: A Lot Difficulty moving from lying on back to sitting on the side of the bed? : Unable Difficulty sitting down on and standing up from a chair with arms (e.g., wheelchair, bedside commode, etc,.)?: Unable Help needed moving to and from a bed to chair (including a wheelchair)?: A Lot Help needed walking in hospital room?: A Lot Help needed climbing 3-5 steps with a railing? : Total 6 Click Score: 9    End of Session Equipment Utilized During Treatment: Gait belt Activity Tolerance: Patient tolerated treatment well;Other (comment)(Nauseated towards end of walk) Patient left: in chair;with call bell/phone within reach;with family/visitor present Nurse Communication: Mobility status PT Visit Diagnosis: Other abnormalities of gait and mobility (R26.89);Unsteadiness on feet (R26.81);Pain Pain - Right/Left: Left Pain - part of body: Hip     Time: 1053-1130 PT Time Calculation (min) (ACUTE ONLY): 37 min  Charges:  $Gait Training: 8-22 mins $Therapeutic Exercise: 8-22 mins                    G Codes:       Roney Marion, PT  Acute Rehabilitation Services Pager [phone] Office [phone]    [name] 03/14/2017, 11:48 AM

## 2017-03-15 NOTE — Evaluation (Signed)
Occupational Therapy Evaluation and Discharge Patient Details Name: Brandi Mitchell MRN: [id number] DOB: [birth year] Today's Date: 03/15/2017    History of Present Illness Pt is a 57 y/o female s/p L THA, direct anterior approach. PMH inlcudes heart murmur.    Clinical Impression   PTA Pt independent in ADL and mobility. Pt is currently mod A for LB ADL and min guard for transfers with RW. Education focused on AE for LB bathing/dressing, use of 3 in 1, shower safety, compensatory strategies for ADL and caregiver education. Pt and husband verbalized understanding and had no further questions or concerns for OT at then end of the session. Education complete. OT to sign off at this time. Thank you for the opportunity to serve this patient.    Follow Up Recommendations  Supervision/Assistance - 24 hour(initially)    Equipment Recommendations  3 in 1 bedside commode    Recommendations for Other Services       Precautions / Restrictions Precautions Precautions: Fall Restrictions Weight Bearing Restrictions: Yes LLE Weight Bearing: Weight bearing as tolerated      Mobility Bed Mobility Overal bed mobility: Needs Assistance Bed Mobility: Sit to Supine     Supine to sit: Min guard Sit to supine: Min assist   General bed mobility comments: Pt witnessed getting back into bed with RN from ambulating to bathroom  Transfers Overall transfer level: Needs assistance Equipment used: Rolling walker (2 wheeled) Transfers: Sit to/from Stand Sit to Stand: Min guard         General transfer comment: not attempted this session    Balance Overall balance assessment: Needs assistance Sitting-balance support: No upper extremity supported;Feet supported Sitting balance-Leahy Scale: Fair Sitting balance - Comments: sitting EOB   Standing balance support: Bilateral upper extremity supported;During functional activity Standing balance-Leahy Scale: Poor Standing balance comment: Reliant on BUE  support                           ADL either performed or assessed with clinical judgement   ADL Overall ADL's : Needs assistance/impaired Eating/Feeding: Modified independent;Sitting;Bed level   Grooming: Set up Grooming Details (indicate cue type and reason): in sitting Upper Body Bathing: Modified independent;Sitting   Lower Body Bathing: Moderate assistance;Sitting/lateral leans Lower Body Bathing Details (indicate cue type and reason): able to bring RLE up and cross knees, requires assist from husband or long handle sponge for LLE Upper Body Dressing : Modified independent   Lower Body Dressing: Moderate assistance;With caregiver independent assisting;With adaptive equipment Lower Body Dressing Details (indicate cue type and reason): able to bring RLE up and cross knees, requires assist from husband or reacher/grabber for LLE; educated to dress operated leg first Toilet Transfer: Min guard   Toileting- Water quality scientist and Hygiene: Min guard;With caregiver independent assisting       Functional mobility during ADLs: Min guard;Rolling walker General ADL Comments: Pt edcuated in compensatory strategies as well as AE for LB ADL     Vision Patient Visual Report: No change from baseline       Perception     Praxis      Pertinent Vitals/Pain Pain Assessment: 0-10 Pain Score: 8  Faces Pain Scale: Hurts little more Pain Location: L hip  Pain Descriptors / Indicators: Aching;Operative site guarding Pain Intervention(s): Premedicated before session;Ice applied     Hand Dominance Right   Extremity/Trunk Assessment Upper Extremity Assessment Upper Extremity Assessment: Overall WFL for tasks assessed   Lower Extremity Assessment Lower  Extremity Assessment: LLE deficits/detail LLE Deficits / Details: Deficits consistent with post op pain and weakness. LLE Sensation: decreased light touch LLE Coordination: decreased gross motor   Cervical / Trunk  Assessment Cervical / Trunk Assessment: Normal   Communication Communication Communication: No difficulties   Cognition Arousal/Alertness: Awake/alert Behavior During Therapy: WFL for tasks assessed/performed Overall Cognitive Status: Within Functional Limits for tasks assessed                                     General Comments  Husband present throughout session and participant in ADL strategies. He is willing and able to assist his wife as needed    Exercises Exercises: Total Joint;Other exercises Total Joint Exercises Ankle Circles/Pumps: AAROM;Left;10 reps;Limitations Ankle Circles/Pumps Limitations: Pt reporting increased ankle movement this session for previous session. Unable to reach neutral actively.  Quad Sets: AROM;Left;10 reps Heel Slides: AAROM;Left;5 reps(painful ) Other Exercises Other Exercises: Taught about self assisted DF using sheet. Held for 20 sec. Educated about performing AROM along with assist to get to neutral.    Shoulder Instructions      Home Living Family/patient expects to be discharged to:: Private residence Living Arrangements: Spouse/significant other Available Help at Discharge: Family;Available 24 hours/day Type of Home: House Home Access: Stairs to enter [employer] of Steps: 2 Entrance Stairs-Rails: None Home Layout: Two level;Able to live on main level with bedroom/bathroom Alternate Level Stairs-Number of Steps: flight   Bathroom Shower/Tub: Occupational psychologist: Standard Bathroom Accessibility: Yes How Accessible: Accessible via walker Home Equipment: [place] - 2 wheels;Shower seat          Prior Functioning/Environment Level of Independence: Independent                 OT Problem List: Decreased strength;Decreased range of motion;Decreased activity tolerance;Impaired balance (sitting and/or standing);Decreased knowledge of use of DME or AE;Pain      OT Treatment/Interventions:       OT Goals(Current goals can be found in the care plan section) Acute Rehab OT Goals Patient Stated Goal: to get back to PLOF to travel OT Goal Formulation: With patient/family Time For Goal Achievement: 03/22/17 Potential to Achieve Goals: Good  OT Frequency:     Barriers to D/C:            Co-evaluation              AM-PAC PT "6 Clicks" Daily Activity     Outcome Measure Help from another person eating meals?: None Help from another person taking care of personal grooming?: None(in seated position) Help from another person toileting, which includes using toliet, bedpan, or urinal?: A Little Help from another person bathing (including washing, rinsing, drying)?: A Lot Help from another person to put on and taking off regular upper body clothing?: None Help from another person to put on and taking off regular lower body clothing?: A Lot 6 Click Score: 19   End of Session Nurse Communication: Mobility status  Activity Tolerance: Patient tolerated treatment well Patient left: in bed;with call bell/phone within reach;with family/visitor present  OT Visit Diagnosis: Unsteadiness on feet (R26.81);Other abnormalities of gait and mobility (R26.89);Pain Pain - Right/Left: Left Pain - part of body: Hip                Time: 9323-5573 OT Time Calculation (min): 24 min Charges:  OT General Charges $OT Visit: 1 Visit OT Evaluation $  OT Eval Moderate Complexity: 1 Mod OT Treatments $Self Care/Home Management : 8-22 mins G-Codes:     Hulda Humphrey OTR/L [place] 03/15/2017, 3:00 PM

## 2017-03-15 NOTE — Progress Notes (Signed)
Physical Therapy Treatment Patient Details Name: Brandi Mitchell MRN: [id number] DOB: [birth year] Today's Date: 03/15/2017    History of Present Illness Pt is a 57 y/o female s/p L THA, direct anterior approach. PMH inlcudes heart murmur.     PT Comments    Pt progressing well towards goals. Much improved gait distance this session, however, pt continues to present with decreased DF and weakness throughout LLE; pt does report ankle ROM has improved. Min guard to min A throughout session. Spoke with PA about getting KI for stability during stair navigation and need for further PT sessions tomorrow to ensure safety with stairs, and PA agreeable. Will continue to follow acutely to maximize functional mobility independence.   Follow Up Recommendations  DC plan and follow up therapy as arranged by surgeon;Supervision for mobility/OOB     Equipment Recommendations  3in1 (PT);Other (comment)(Consider PRAFO for dorsiflexion preservation; KI)    Recommendations for Other Services OT consult     Precautions / Restrictions Precautions Precautions: Fall Restrictions Weight Bearing Restrictions: Yes LLE Weight Bearing: Weight bearing as tolerated    Mobility  Bed Mobility Overal bed mobility: Needs Assistance Bed Mobility: Supine to Sit     Supine to sit: Min guard     General bed mobility comments: Min guard for safety and for suppport of LLE. Required increased time to perform.   Transfers Overall transfer level: Needs assistance Equipment used: Rolling walker (2 wheeled) Transfers: Sit to/from Stand Sit to Stand: Min guard         General transfer comment: Min guard for safety. Able to use RLE for most of power up throughout standing.   Ambulation/Gait Ambulation/Gait assistance: Min guard;Min assist Ambulation Distance (Feet): 350 Feet Assistive device: Rolling walker (2 wheeled) Gait Pattern/deviations: Decreased step length - left;Decreased dorsiflexion -  left;Antalgic;Step-to pattern Gait velocity: extremely slow Gait velocity interpretation: Below normal speed for age/gender General Gait Details: Pt with much improved gait tolerance this session. Slight improvement in L ankle DF, however, continues to drag. Pt able to use compensatory strategies (circumduction, and hip hiking) for advancement of LLE. Reliant on UE for advancement of RLE. Verbal cues for knee extension during stance on LLE as pt with some quad control (able to perform quad set). Occasional LLE buckling requiring min A for steadying. Pt unable to perform hip extension to slide LLE back towards chair upon sitting.    Stairs            Wheelchair Mobility    Modified Rankin (Stroke Patients Only)       Balance Overall balance assessment: Needs assistance Sitting-balance support: No upper extremity supported;Feet supported Sitting balance-Leahy Scale: Fair     Standing balance support: Bilateral upper extremity supported;During functional activity Standing balance-Leahy Scale: Poor Standing balance comment: Reliant on BUE support                            Cognition Arousal/Alertness: Awake/alert Behavior During Therapy: WFL for tasks assessed/performed Overall Cognitive Status: Within Functional Limits for tasks assessed                                        Exercises Total Joint Exercises Ankle Circles/Pumps: AAROM;Left;10 reps;Limitations Ankle Circles/Pumps Limitations: Pt reporting increased ankle movement this session for previous session. Unable to reach neutral actively.  Quad Sets: AROM;Left;10 reps Heel Slides: AAROM;Left;5 reps(painful )  Other Exercises Other Exercises: Taught about self assisted DF using sheet. Held for 20 sec. Educated about performing AROM along with assist to get to neutral.     General Comments General comments (skin integrity, edema, etc.): Husband present throughout session. Educated about  positioning and exercises to perform to maintain ankle ROM.       Pertinent Vitals/Pain Pain Assessment: Faces Faces Pain Scale: Hurts little more Pain Location: L hip  Pain Descriptors / Indicators: Aching;Operative site guarding Pain Intervention(s): Limited activity within patient's tolerance;Monitored during session;Repositioned    Home Living                      Prior Function            PT Goals (current goals can now be found in the care plan section) Acute Rehab PT Goals Patient Stated Goal: to decrease pain; she and her husband enjoy spontaneous trips PT Goal Formulation: With patient Time For Goal Achievement: 03/27/17 Potential to Achieve Goals: Good Progress towards PT goals: Progressing toward goals    Frequency    7X/week      PT Plan Current plan remains appropriate    Co-evaluation              AM-PAC PT "6 Clicks" Daily Activity  Outcome Measure  Difficulty turning over in bed (including adjusting bedclothes, sheets and blankets)?: A Little Difficulty moving from lying on back to sitting on the side of the bed? : Unable Difficulty sitting down on and standing up from a chair with arms (e.g., wheelchair, bedside commode, etc,.)?: Unable Help needed moving to and from a bed to chair (including a wheelchair)?: A Little Help needed walking in hospital room?: A Little Help needed climbing 3-5 steps with a railing? : A Lot 6 Click Score: 13    End of Session Equipment Utilized During Treatment: Gait belt Activity Tolerance: Patient tolerated treatment well Patient left: in chair;with call bell/phone within reach;with family/visitor present Nurse Communication: Mobility status;Other (comment)(KI for stair navigation. ) PT Visit Diagnosis: Other abnormalities of gait and mobility (R26.89);Unsteadiness on feet (R26.81);Pain Pain - Right/Left: Left Pain - part of body: Hip     Time: 0092-3300 PT Time Calculation (min) (ACUTE ONLY): 52  min  Charges:  $Gait Training: 23-37 mins $Therapeutic Exercise: 8-22 mins                    G Codes:       Leighton Ruff, PT, DPT  Acute Rehabilitation Services  Pager: [phone]    [name] 03/15/2017, 2:10 PM

## 2017-03-15 NOTE — Progress Notes (Signed)
Orthopedic Tech Progress Note Patient Details:  Brandi Mitchell 1961/01/29 [id number]  Ortho Devices Type of Ortho Device: Knee Immobilizer Ortho Device/Splint Location: lle Ortho Device/Splint Interventions: Application   Post Interventions Patient Tolerated: Well Instructions Provided: Care of device   Hildred Priest 03/15/2017, 4:10 PM

## 2017-03-15 NOTE — Care Management Note (Signed)
Case Management Note  Patient Details  Name: Brandi Mitchell MRN: [id number] Date of Birth: [birth year]  Subjective/Objective:  57 yr old female s/p left total hip arthroplasty.                  Action/Plan: Case manager spoke with patient concerning discharge plan and DME. Choice for [facility] was offered, referral was called to Neoma Laming, [facility] Liaison.  Patient will have family support at discharge.    Expected Discharge Date:   03/16/16               Expected Discharge Plan:  [place]  In-House Referral:     Discharge planning Services  CM Consult  Post Acute Care Choice:  Home Health Choice offered to:  Patient  DME Arranged:  N/A(has RW, borrowing 3in1, and shower chair) DME Agency:  NA  HH Arranged:  [facility] [place] Agency:  [facility]  Status of Service:  Completed, signed off  If discussed at [place] of Stay Meetings, dates discussed:    Additional Comments:  Ninfa Meeker, RN 03/15/2017, 11:46 AM

## 2017-03-15 NOTE — Progress Notes (Signed)
Subjective: 2 Days Post-Op Procedure(s) (LRB): TOTAL HIP ARTHROPLASTY ANTERIOR APPROACH (Left)   Patient is feeling better this morning. She still has some neubness in her foot but she can move her ankle better today. She would liek to go home if possible today but thinks tomorrow may be better.  Activity level:  wbat Diet tolerance:  ok Voiding:  ok Patient reports pain as mild.    Objective: Vital signs in last 24 hours: Temp:  [98.1 F (36.7 C)-99.3 F (37.4 C)] 99.3 F (37.4 C) (01/24 0335) Pulse Rate:  [76-93] 93 (01/24 0335) Resp:  [18] 18 (01/24 0335) BP: (128-144)/(58-70) 144/68 (01/24 0335) SpO2:  [98 %] 98 % (01/24 0335)  Labs: No results for input(s): HGB in the last 72 hours. No results for input(s): WBC, RBC, HCT, PLT in the last 72 hours. No results for input(s): NA, K, CL, CO2, BUN, CREATININE, GLUCOSE, CALCIUM in the last 72 hours. No results for input(s): LABPT, INR in the last 72 hours.  Physical Exam:  Neurologically intact ABD soft Neurovascular intact Sensation intact distally Intact pulses distally Dorsiflexion/Plantar flexion intact Incision: dressing C/D/I and no drainage No cellulitis present Compartment soft  Assessment/Plan:  2 Days Post-Op Procedure(s) (LRB): TOTAL HIP ARTHROPLASTY ANTERIOR APPROACH (Left) Advance diet Up with therapy Plan for discharge tomorrow Discharge home with home health if doing well and cleared by PT. Continue on ASA 325mg  BID x 4 weeks post op. 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 Since it is improving we will continue to monitor.  I informed her that her hip x-ray looks great and she is releived by that.  Follow up in office 2 weeks post op.  Brandi Mitchell, Larwance Sachs 03/15/2017, 7:02 AM

## 2017-03-15 NOTE — Progress Notes (Signed)
Physical Therapy Treatment Patient Details Name: Brandi Mitchell MRN: [id number] DOB: Apr 21, 1960 Today's Date: 03/15/2017    History of Present Illness Pt is a 57 y/o female s/p L THA, direct anterior approach. PMH inlcudes heart murmur.     PT Comments    Pt progressing towards goals. Increased muscle fatigue noted in afternoon session, so performed decreased ambulation distance and deferred HEP. Required increased help initially for advancing LLE. Used ace wrap to help with DF assist throughout gait and pt reports it helped. Deferred stair navigation until tomorrow when pt has L KI. Will continue to follow acutely to maximize functional mobility independence and safety.   Follow Up Recommendations  DC plan and follow up therapy as arranged by surgeon;Supervision for mobility/OOB     Equipment Recommendations  3in1 (PT);Other (comment)(PRAFO; L KI for stairs )    Recommendations for Other Services OT consult     Precautions / Restrictions Precautions Precautions: Fall Restrictions Weight Bearing Restrictions: Yes LLE Weight Bearing: Weight bearing as tolerated    Mobility  Bed Mobility Overal bed mobility: Needs Assistance Bed Mobility: Sit to Supine;Supine to Sit     Supine to sit: Supervision Sit to supine: Min assist   General bed mobility comments: Supervision to sit EOB. Pt assisting LLE and required min A from PT for LLE lift assist back to bed as pt very weak in hip flexors.   Transfers Overall transfer level: Needs assistance Equipment used: Rolling walker (2 wheeled) Transfers: Sit to/from Stand Sit to Stand: Min guard         General transfer comment: Min guard safety.   Ambulation/Gait Ambulation/Gait assistance: Min guard Ambulation Distance (Feet): 200 Feet Assistive device: Rolling walker (2 wheeled) Gait Pattern/deviations: Decreased step length - left;Decreased dorsiflexion - left;Antalgic;Step-to pattern Gait velocity: extremely slow Gait  velocity interpretation: Below normal speed for age/gender General Gait Details: Noted increased muscle fatigue in afternoon session with gait. Used ace wrap to assist with DF assist to ensure foot clearance. Pt refusing to use sock on LLE this session to assist with ease of LLE advancement. Initially required manual assist for LLE movement, however, improved advancement noted with increased gait distance. Requesting to let pt assist with advancing LLE and did not want PT assist.    Stairs            Wheelchair Mobility    Modified Rankin (Stroke Patients Only)       Balance Overall balance assessment: Needs assistance Sitting-balance support: No upper extremity supported;Feet supported Sitting balance-Leahy Scale: Good Sitting balance - Comments: sitting EOB   Standing balance support: Bilateral upper extremity supported;During functional activity Standing balance-Leahy Scale: Poor Standing balance comment: Reliant on BUE support. Reliant on B elbow support for washing hands at sink following toileting.                             Cognition Arousal/Alertness: Awake/alert Behavior During Therapy: WFL for tasks assessed/performed Overall Cognitive Status: Within Functional Limits for tasks assessed                                        Exercises Total Joint Exercises Ankle Circles/Pumps: AAROM;Left;10 reps;Limitations Ankle Circles/Pumps Limitations: Pt reporting increased ankle movement this session for previous session. Unable to reach neutral actively.  Quad Sets: AROM;Left;10 reps Heel Slides: AAROM;Left;5 reps(painful ) Other Exercises Other  Exercises: Taught about self assisted DF using sheet. Held for 20 sec. Educated about performing AROM along with assist to get to neutral.     General Comments General comments (skin integrity, edema, etc.): Educated about waiting on KI to perform stair navigation to increase safety. Further supine  ther ex deferred secondary to increased muscle fatigue. L ankle positioned in neutral using pillow at end of session.       Pertinent Vitals/Pain Pain Assessment: Faces Pain Score: 8  Faces Pain Scale: Hurts even more Pain Location: L hip  Pain Descriptors / Indicators: Aching;Operative site guarding Pain Intervention(s): Limited activity within patient's tolerance;Monitored during session;Patient requesting pain meds-RN notified    Home Living Family/patient expects to be discharged to:: Private residence Living Arrangements: Spouse/significant other Available Help at Discharge: Family;Available 24 hours/day Type of Home: House Home Access: Stairs to enter Entrance Stairs-Rails: None Home Layout: Two level;Able to live on main level with bedroom/bathroom Home Equipment: Gilford Rile - 2 wheels;Shower seat      Prior Function Level of Independence: Independent          PT Goals (current goals can now be found in the care plan section) Acute Rehab PT Goals Patient Stated Goal: to get back to PLOF to travel PT Goal Formulation: With patient Time For Goal Achievement: 03/27/17 Potential to Achieve Goals: Good Progress towards PT goals: Progressing toward goals    Frequency    7X/week      PT Plan Current plan remains appropriate    Co-evaluation              AM-PAC PT "6 Clicks" Daily Activity  Outcome Measure  Difficulty turning over in bed (including adjusting bedclothes, sheets and blankets)?: A Little Difficulty moving from lying on back to sitting on the side of the bed? : A Lot Difficulty sitting down on and standing up from a chair with arms (e.g., wheelchair, bedside commode, etc,.)?: Unable Help needed moving to and from a bed to chair (including a wheelchair)?: A Little Help needed walking in hospital room?: A Little Help needed climbing 3-5 steps with a railing? : A Lot 6 Click Score: 14    End of Session Equipment Utilized During Treatment: Gait  belt Activity Tolerance: Patient tolerated treatment well Patient left: in bed;with call bell/phone within reach;with family/visitor present Nurse Communication: Mobility status PT Visit Diagnosis: Other abnormalities of gait and mobility (R26.89);Unsteadiness on feet (R26.81);Pain Pain - Right/Left: Left Pain - part of body: Hip     Time: 6256-3893 PT Time Calculation (min) (ACUTE ONLY): 26 min  Charges:  $Gait Training: 8-22 mins $Therapeutic Exercise: 8-22 mins $Therapeutic Activity: 8-22 mins                    G Codes:       Leighton Ruff, PT, DPT  Acute Rehabilitation Services  Pager: [phone]    [name] 03/15/2017, 4:55 PM

## 2017-03-16 MED ORDER — DOCUSATE SODIUM 100 MG PO CAPS: 100.0000 mg | ORAL_CAPSULE | Freq: Two times a day (BID) | ORAL | 0 refills | Status: DC

## 2017-03-16 MED ORDER — ASPIRIN 325 MG PO TBEC: 325.0000 mg | DELAYED_RELEASE_TABLET | Freq: Two times a day (BID) | ORAL | 0 refills | Status: DC

## 2017-03-16 MED ORDER — BISACODYL 5 MG PO TBEC: 5.0000 mg | DELAYED_RELEASE_TABLET | Freq: Every day | ORAL | 0 refills | Status: DC | PRN

## 2017-03-16 MED ORDER — PROMETHAZINE HCL 12.5 MG PO TABS: 12.5000 mg | ORAL_TABLET | Freq: Four times a day (QID) | ORAL | 0 refills | Status: DC | PRN

## 2017-03-16 MED ORDER — HYDROCODONE-ACETAMINOPHEN 5-325 MG PO TABS: 1.0000 | ORAL_TABLET | ORAL | 0 refills | Status: DC | PRN

## 2017-03-16 MED ORDER — TIZANIDINE HCL 4 MG PO TABS: 4.0000 mg | ORAL_TABLET | Freq: Three times a day (TID) | ORAL | 1 refills | Status: DC

## 2017-03-16 NOTE — Discharge Summary (Signed)
Patient ID: Brandi Mitchell MRN: [id number] DOB/AGE: 08-02-1960 57 y.o.  Admit date: 03/13/2017 Discharge date: 03/16/2017  Admission Diagnoses:  Principal Problem:   Primary osteoarthritis of left hip   Discharge Diagnoses:  Same  Past Medical History:  Diagnosis Date  . Degenerative joint disease (DJD) of hip    left hip  . Heart murmur    "I have had it all my life but no issues with it"  . Migraine headache   . Thyroid disease     Surgeries: Procedure(s): TOTAL HIP ARTHROPLASTY ANTERIOR APPROACH on 03/13/2017   Consultants:   Discharged Condition: Improved  Hospital Course: Brandi Mitchell is an 57 y.o. female who was admitted 03/13/2017 for operative treatment ofPrimary osteoarthritis of left hip. Patient has severe unremitting pain that affects sleep, daily activities, and work/hobbies. After pre-op clearance the patient was taken to the operating room on 03/13/2017 and underwent  Procedure(s): TOTAL HIP ARTHROPLASTY ANTERIOR APPROACH.    Patient was given perioperative antibiotics:  Anti-infectives (From admission, onward)   Start     Dose/Rate Route Frequency Ordered Stop   03/13/17 1930  vancomycin (VANCOCIN) IVPB 1000 mg/200 mL premix     1,000 mg 200 mL/hr over 60 Minutes Intravenous Every 12 hours 03/13/17 1338 03/13/17 2110   03/13/17 0616  vancomycin (VANCOCIN) 1-5 GM/200ML-% IVPB    Comments:  Forte, Lindsi   : cabinet override      03/13/17 0616 03/13/17 0730   03/13/17 0612  vancomycin (VANCOCIN) IVPB 1000 mg/200 mL premix     1,000 mg 200 mL/hr over 60 Minutes Intravenous On call to O.R. 03/13/17 2440 03/13/17 0750       Patient was given sequential compression devices, early ambulation, and chemoprophylaxis to prevent DVT.  Patient benefited maximally from hospital stay and there were no complications.    Recent vital signs:  Patient Vitals for the past 24 hrs:  BP Temp Temp src Pulse Resp SpO2  03/16/17 0548 140/74 98.6 F (37 C) Oral 91 17 99 %   03/15/17 1900 132/71 98.4 F (36.9 C) Oral 87 18 98 %  03/15/17 1300 130/73 98.4 F (36.9 C) Oral 85 18 98 %     Recent laboratory studies: No results for input(s): WBC, HGB, HCT, PLT, NA, K, CL, CO2, BUN, CREATININE, GLUCOSE, INR, CALCIUM in the last 72 hours.  Invalid input(s): PT, 2   Discharge Medications:   Allergies as of 03/16/2017      Reactions   Indomethacin Nausea Only   Keflex [cephalexin] Nausea Only   Percocet [oxycodone-acetaminophen] Nausea Only   Tramadol Nausea Only      Medication List    STOP taking these medications   naproxen sodium 220 MG tablet Commonly known as:  ALEVE     TAKE these medications   aspirin 325 MG EC tablet Take 1 tablet (325 mg total) by mouth 2 (two) times daily after a meal.   bisacodyl 5 MG EC tablet Commonly known as:  DULCOLAX Take 1 tablet (5 mg total) by mouth daily as needed for moderate constipation.   docusate sodium 100 MG capsule Commonly known as:  COLACE Take 1 capsule (100 mg total) by mouth 2 (two) times daily.   HYDROcodone-acetaminophen 5-325 MG tablet Commonly known as:  NORCO/VICODIN Take 1-2 tablets by mouth every 4 (four) hours as needed for severe pain ((score 7 to 10)).   levothyroxine 75 MCG tablet Commonly known as:  SYNTHROID, LEVOTHROID Take 75 mcg by mouth daily before breakfast.  promethazine 12.5 MG tablet Commonly known as:  PHENERGAN Take 1-2 tablets (12.5-25 mg total) by mouth every 6 (six) hours as needed for nausea or vomiting.   tiZANidine 4 MG tablet Commonly known as:  ZANAFLEX Take 1 tablet (4 mg total) by mouth 3 (three) times daily.            Durable Medical Equipment  (From admission, onward)        Start     Ordered   03/13/17 1528  DME Walker rolling  Once    Question:  Patient needs a walker to treat with the following condition  Answer:  Primary osteoarthritis of left hip   03/13/17 1527   03/13/17 1528  DME 3 n 1  Once     03/13/17 1527   03/13/17 1528   DME Bedside commode  Once    Question:  Patient needs a bedside commode to treat with the following condition  Answer:  Primary osteoarthritis of left hip   03/13/17 1527      Diagnostic Studies: Dg Chest 2 View  Result Date: 03/07/2017 CLINICAL DATA:  Pre-admission radiograph. EXAM: CHEST  2 VIEW COMPARISON:  04/29/2013 cc. FINDINGS: The heart size and mediastinal contours are within normal limits. Both lungs are clear. The visualized skeletal structures are unremarkable. IMPRESSION: No active cardiopulmonary disease. Electronically Signed   By: Kerby Moors M.D.   On: 03/07/2017 16:31   Dg C-arm 1-60 Min  Result Date: 03/13/2017 CLINICAL DATA:  Left hip replacement EXAM: OPERATIVE LEFT HIP (WITH PELVIS IF PERFORMED) 2 VIEWS TECHNIQUE: Fluoroscopic spot image(s) were submitted for interpretation post-operatively. COMPARISON:  None. FINDINGS: Intraoperative images demonstrate changes of left hip replacement. Normal AP alignment. No visible hardware complicating feature. IMPRESSION: Left hip replacement.  No visible complicating feature. Electronically Signed   By: Rolm Baptise M.D.   On: 03/13/2017 09:30   Dg Hip Operative Unilat With Pelvis Left  Result Date: 03/13/2017 CLINICAL DATA:  Left hip replacement EXAM: OPERATIVE LEFT HIP (WITH PELVIS IF PERFORMED) 2 VIEWS TECHNIQUE: Fluoroscopic spot image(s) were submitted for interpretation post-operatively. COMPARISON:  None. FINDINGS: Intraoperative images demonstrate changes of left hip replacement. Normal AP alignment. No visible hardware complicating feature. IMPRESSION: Left hip replacement.  No visible complicating feature. Electronically Signed   By: Rolm Baptise M.D.   On: 03/13/2017 09:30   Dg Hip Unilat With Pelvis 2-3 Views Left  Result Date: 03/14/2017 CLINICAL DATA:  Pain in the left hip and thigh EXAM: DG HIP (WITH OR WITHOUT PELVIS) 2-3V LEFT COMPARISON:  10/15/2015 FINDINGS: Marked arthritis of the right hip. Pubic symphysis and  rami appear intact. Patient is status post left hip replacement with normal alignment. No fracture. Moderate gas in the soft tissues. IMPRESSION: Status post left hip replacement with normal alignment. Moderate gas in the soft tissues presumably related to recent surgery. No acute osseous abnormality. Electronically Signed   By: Donavan Foil M.D.   On: 03/14/2017 21:54    Disposition: 01-Home or Self Care  Discharge Instructions    Call MD / Call 911   Complete by:  As directed    If you experience chest pain or shortness of breath, CALL 911 and be transported to the hospital emergency room.  If you develope a fever above 101 F, pus (white drainage) or increased drainage or redness at the wound, or calf pain, call your surgeon's office.   Constipation Prevention   Complete by:  As directed    Drink plenty of  fluids.  Prune juice may be helpful.  You may use a stool softener, such as Colace (over the counter) 100 mg twice a day.  Use MiraLax (over the counter) for constipation as needed.   Diet - low sodium heart healthy   Complete by:  As directed    Discharge instructions   Complete by:  As directed    INSTRUCTIONS AFTER JOINT REPLACEMENT   Remove items at home which could result in a fall. This includes throw rugs or furniture in walking pathways ICE to the affected joint every three hours while awake for 30 minutes at a time, for at least the first 3-5 days, and then as needed for pain and swelling.  Continue to use ice for pain and swelling. You may notice swelling that will progress down to the foot and ankle.  This is normal after surgery.  Elevate your leg when you are not up walking on it.   Continue to use the breathing machine you got in the hospital (incentive spirometer) which will help keep your temperature down.  It is common for your temperature to cycle up and down following surgery, especially at night when you are not up moving around and exerting yourself.  The breathing  machine keeps your lungs expanded and your temperature down.   DIET:  As you were doing prior to hospitalization, we recommend a well-balanced diet.  DRESSING / WOUND CARE / SHOWERING  You may shower 3 days after surgery, but keep the wounds dry during showering.  You may use an occlusive plastic wrap (Press'n Seal for example), NO SOAKING/SUBMERGING IN THE BATHTUB.  If the bandage gets wet, change with a clean dry gauze.  If the incision gets wet, pat the wound dry with a clean towel.  ACTIVITY  Increase activity slowly as tolerated, but follow the weight bearing instructions below.   No driving for 6 weeks or until further direction given by your physician.  You cannot drive while taking narcotics.  No lifting or carrying greater than 10 lbs. until further directed by your surgeon. Avoid periods of inactivity such as sitting longer than an hour when not asleep. This helps prevent blood clots.  You may return to work once you are authorized by your doctor.     WEIGHT BEARING   Weight bearing as tolerated with assist device (walker, cane, etc) as directed, use it as long as suggested by your surgeon or therapist, typically at least 4-6 weeks.   EXERCISES  Results after joint replacement surgery are often greatly improved when you follow the exercise, range of motion and muscle strengthening exercises prescribed by your doctor. Safety measures are also important to protect the joint from further injury. Any time any of these exercises cause you to have increased pain or swelling, decrease what you are doing until you are comfortable again and then slowly increase them. If you have problems or questions, call your caregiver or physical therapist for advice.   Rehabilitation is important following a joint replacement. After just a few days of immobilization, the muscles of the leg can become weakened and shrink (atrophy).  These exercises are designed to build up the tone and strength of the  thigh and leg muscles and to improve motion. Often times heat used for twenty to thirty minutes before working out will loosen up your tissues and help with improving the range of motion but do not use heat for the first two weeks following surgery (sometimes heat can increase post-operative  swelling).   These exercises can be done on a training (exercise) mat, on the floor, on a table or on a bed. Use whatever works the best and is most comfortable for you.    Use music or television while you are exercising so that the exercises are a pleasant break in your day. This will make your life better with the exercises acting as a break in your routine that you can look forward to.   Perform all exercises about fifteen times, three times per day or as directed.  You should exercise both the operative leg and the other leg as well.   Exercises include:   Quad Sets - Tighten up the muscle on the front of the thigh (Quad) and hold for 5-10 seconds.   Straight Leg Raises - With your knee straight (if you were given a brace, keep it on), lift the leg to 60 degrees, hold for 3 seconds, and slowly lower the leg.  Perform this exercise against resistance later as your leg gets stronger.  Leg Slides: Lying on your back, slowly slide your foot toward your buttocks, bending your knee up off the floor (only go as far as is comfortable). Then slowly slide your foot back down until your leg is flat on the floor again.  Angel Wings: Lying on your back spread your legs to the side as far apart as you can without causing discomfort.  Hamstring Strength:  Lying on your back, push your heel against the floor with your leg straight by tightening up the muscles of your buttocks.  Repeat, but this time bend your knee to a comfortable angle, and push your heel against the floor.  You may put a pillow under the heel to make it more comfortable if necessary.   A rehabilitation program following joint replacement surgery can speed  recovery and prevent re-injury in the future due to weakened muscles. Contact your doctor or a physical therapist for more information on knee rehabilitation.    CONSTIPATION  Constipation is defined medically as fewer than three stools per week and severe constipation as less than one stool per week.  Even if you have a regular bowel pattern at home, your normal regimen is likely to be disrupted due to multiple reasons following surgery.  Combination of anesthesia, postoperative narcotics, change in appetite and fluid intake all can affect your bowels.   YOU MUST use at least one of the following options; they are listed in order of increasing strength to get the job done.  They are all available over the counter, and you may need to use some, POSSIBLY even all of these options:    Drink plenty of fluids (prune juice may be helpful) and high fiber foods Colace 100 mg by mouth twice a day  Senokot for constipation as directed and as needed Dulcolax (bisacodyl), take with full glass of water  Miralax (polyethylene glycol) once or twice a day as needed.  If you have tried all these things and are unable to have a bowel movement in the first 3-4 days after surgery call either your surgeon or your primary doctor.    If you experience loose stools or diarrhea, hold the medications until you stool forms back up.  If your symptoms do not get better within 1 week or if they get worse, check with your doctor.  If you experience "the worst abdominal pain ever" or develop nausea or vomiting, please contact the office immediately for further recommendations for  treatment.   ITCHING:  If you experience itching with your medications, try taking only a single pain pill, or even half a pain pill at a time.  You can also use Benadryl over the counter for itching or also to help with sleep.   TED HOSE STOCKINGS:  Use stockings on both legs until for at least 2 weeks or as directed by physician office. They may be  removed at night for sleeping.  MEDICATIONS:  See your medication summary on the "After Visit Summary" that nursing will review with you.  You may have some home medications which will be placed on hold until you complete the course of blood thinner medication.  It is important for you to complete the blood thinner medication as prescribed.  PRECAUTIONS:  If you experience chest pain or shortness of breath - call 911 immediately for transfer to the hospital emergency department.   If you develop a fever greater that 101 F, purulent drainage from wound, increased redness or drainage from wound, foul odor from the wound/dressing, or calf pain - CONTACT YOUR SURGEON.                                                   FOLLOW-UP APPOINTMENTS:  If you do not already have a post-op appointment, please call the office for an appointment to be seen by your surgeon.  Guidelines for how soon to be seen are listed in your "After Visit Summary", but are typically between 1-4 weeks after surgery.  OTHER INSTRUCTIONS:   Knee Replacement:  Do not place pillow under knee, focus on keeping the knee straight while resting. CPM instructions: 0-90 degrees, 2 hours in the morning, 2 hours in the afternoon, and 2 hours in the evening. Place foam block, curve side up under heel at all times except when in CPM or when walking.  DO NOT modify, tear, cut, or change the foam block in any way.  MAKE SURE YOU:  Understand these instructions.  Get help right away if you are not doing well or get worse.    Thank you for letting us be a part of your medical care team.  It is a privilege we respect greatly.  We hope these instructions will help you stay on track for a fast and full recovery!   Increase activity slowly as tolerated   Complete by:  As directed       Follow-up Information    Melrose Nakayama, MD. Schedule an appointment as soon as possible for a visit in 2 week(s).   Specialty:  Orthopedic Surgery Contact  information: [place] [address] [place], [facility] Follow up.   Specialty:  [place] Why:  A representative from [facility] will contact you to arrange start date and time for your therapy. Contact information: [address] [place] [address] [phone]            Signed: Rich Fuchs 03/16/2017, 7:57 AM

## 2017-03-16 NOTE — Progress Notes (Signed)
Physical Therapy Treatment Patient Details Name: Brandi Mitchell MRN: [id number] DOB: 10/18/1960 Today's Date: 03/16/2017    History of Present Illness Pt is a 57 y/o female s/p L THA, direct anterior approach. PMH inlcudes heart murmur.     PT Comments    Patient continues to progress toward mobility goals. Pt demonstrated some improvement in L ankle DF and reported increased sensation below L knee. KI donned for stair training and pt and husband able to safely ascend/descend steps simulating home entrance. Current plan remains appropriate.    Follow Up Recommendations  DC plan and follow up therapy as arranged by surgeon;Supervision for mobility/OOB     Equipment Recommendations  3in1 (PT);Other (comment)(PRAFO; L KI for stairs )    Recommendations for Other Services OT consult     Precautions / Restrictions Precautions Precautions: Fall Restrictions Weight Bearing Restrictions: Yes LLE Weight Bearing: Weight bearing as tolerated    Mobility  Bed Mobility               General bed mobility comments: pt OOB in chair upon arrival  Transfers Overall transfer level: Needs assistance Equipment used: Rolling walker (2 wheeled) Transfers: Sit to/from Stand Sit to Stand: Min guard;Supervision         General transfer comment: min guard from recliner and supervision form BSC for safety; cues for safe hand placement  Ambulation/Gait Ambulation/Gait assistance: Supervision(close supervision) Ambulation Distance (Feet): 300 Feet Assistive device: Rolling walker (2 wheeled) Gait Pattern/deviations: Decreased dorsiflexion - left;Antalgic;Step-to pattern;Decreased stance time - left;Decreased step length - right Gait velocity: decreased   General Gait Details: cues for sequencing, posture, and continuous movement of RW; pt with improved L DF and ability to clear floor    Stairs Stairs: Yes   Stair Management: No rails;Step to pattern;Backwards;With walker Number of  Stairs: 2 General stair comments: KI donned; assist to stabilize RW; husband provided assistance and pt given stair handout  Wheelchair Mobility    Modified Rankin (Stroke Patients Only)       Balance Overall balance assessment: Needs assistance Sitting-balance support: No upper extremity supported;Feet supported Sitting balance-Leahy Scale: Good     Standing balance support: Bilateral upper extremity supported;During functional activity Standing balance-Leahy Scale: Poor                              Cognition Arousal/Alertness: Awake/alert Behavior During Therapy: WFL for tasks assessed/performed Overall Cognitive Status: Within Functional Limits for tasks assessed                                        Exercises      General Comments General comments (skin integrity, edema, etc.): HEP handout reviewed as well as frequency, activity progression at home, and use of ice      Pertinent Vitals/Pain Pain Assessment: Faces Faces Pain Scale: Hurts little more Pain Location: L hip  Pain Descriptors / Indicators: Guarding;Grimacing;Sore Pain Intervention(s): Limited activity within patient's tolerance;Monitored during session;Premedicated before session;Repositioned    Home Living                      Prior Function            PT Goals (current goals can now be found in the care plan section) Acute Rehab PT Goals Patient Stated Goal: to get back to PLOF to travel  PT Goal Formulation: With patient Time For Goal Achievement: 03/27/17 Potential to Achieve Goals: Good Progress towards PT goals: Progressing toward goals    Frequency    7X/week      PT Plan Current plan remains appropriate    Co-evaluation              AM-PAC PT "6 Clicks" Daily Activity  Outcome Measure  Difficulty turning over in bed (including adjusting bedclothes, sheets and blankets)?: A Little Difficulty moving from lying on back to sitting on  the side of the bed? : A Lot Difficulty sitting down on and standing up from a chair with arms (e.g., wheelchair, bedside commode, etc,.)?: Unable Help needed moving to and from a bed to chair (including a wheelchair)?: A Little Help needed walking in hospital room?: A Little Help needed climbing 3-5 steps with a railing? : A Little 6 Click Score: 15    End of Session Equipment Utilized During Treatment: Gait belt Activity Tolerance: Patient tolerated treatment well Patient left: with call bell/phone within reach;with family/visitor present;in chair Nurse Communication: Mobility status PT Visit Diagnosis: Other abnormalities of gait and mobility (R26.89);Unsteadiness on feet (R26.81);Pain Pain - Right/Left: Left Pain - part of body: Hip     Time: 0940-1010 PT Time Calculation (min) (ACUTE ONLY): 30 min  Charges:  $Gait Training: 23-37 mins                    G Codes:       Earney Navy, PTA Pager: [phone]     Darliss Cheney 03/16/2017, 10:18 AM

## 2017-03-17 DIAGNOSIS — G43909 Migraine, unspecified, not intractable, without status migrainosus: Secondary | ICD-10-CM | POA: Diagnosis not present

## 2017-03-17 DIAGNOSIS — E079 Disorder of thyroid, unspecified: Secondary | ICD-10-CM | POA: Diagnosis not present

## 2017-03-17 DIAGNOSIS — Z471 Aftercare following joint replacement surgery: Secondary | ICD-10-CM | POA: Diagnosis not present

## 2017-03-26 DIAGNOSIS — M1612 Unilateral primary osteoarthritis, left hip: Secondary | ICD-10-CM | POA: Diagnosis not present

## 2017-03-26 DIAGNOSIS — Z96642 Presence of left artificial hip joint: Secondary | ICD-10-CM | POA: Diagnosis not present

## 2017-03-26 DIAGNOSIS — Z471 Aftercare following joint replacement surgery: Secondary | ICD-10-CM | POA: Diagnosis not present

## 2017-03-27 DIAGNOSIS — Z471 Aftercare following joint replacement surgery: Secondary | ICD-10-CM | POA: Diagnosis not present

## 2017-03-27 DIAGNOSIS — E079 Disorder of thyroid, unspecified: Secondary | ICD-10-CM | POA: Diagnosis not present

## 2017-03-27 DIAGNOSIS — G43909 Migraine, unspecified, not intractable, without status migrainosus: Secondary | ICD-10-CM | POA: Diagnosis not present

## 2017-03-29 DIAGNOSIS — E079 Disorder of thyroid, unspecified: Secondary | ICD-10-CM | POA: Diagnosis not present

## 2017-03-29 DIAGNOSIS — G43909 Migraine, unspecified, not intractable, without status migrainosus: Secondary | ICD-10-CM | POA: Diagnosis not present

## 2017-03-29 DIAGNOSIS — Z471 Aftercare following joint replacement surgery: Secondary | ICD-10-CM | POA: Diagnosis not present

## 2017-04-02 DIAGNOSIS — M25552 Pain in left hip: Secondary | ICD-10-CM | POA: Diagnosis not present

## 2017-04-02 DIAGNOSIS — Z96642 Presence of left artificial hip joint: Secondary | ICD-10-CM | POA: Diagnosis not present

## 2017-04-02 DIAGNOSIS — R262 Difficulty in walking, not elsewhere classified: Secondary | ICD-10-CM | POA: Diagnosis not present

## 2017-04-05 DIAGNOSIS — Z96642 Presence of left artificial hip joint: Secondary | ICD-10-CM | POA: Diagnosis not present

## 2017-04-05 DIAGNOSIS — R262 Difficulty in walking, not elsewhere classified: Secondary | ICD-10-CM | POA: Diagnosis not present

## 2017-04-05 DIAGNOSIS — M25552 Pain in left hip: Secondary | ICD-10-CM | POA: Diagnosis not present

## 2017-04-09 DIAGNOSIS — R262 Difficulty in walking, not elsewhere classified: Secondary | ICD-10-CM | POA: Diagnosis not present

## 2017-04-09 DIAGNOSIS — M25552 Pain in left hip: Secondary | ICD-10-CM | POA: Diagnosis not present

## 2017-04-09 DIAGNOSIS — Z96642 Presence of left artificial hip joint: Secondary | ICD-10-CM | POA: Diagnosis not present

## 2017-04-11 DIAGNOSIS — R262 Difficulty in walking, not elsewhere classified: Secondary | ICD-10-CM | POA: Diagnosis not present

## 2017-04-11 DIAGNOSIS — M25552 Pain in left hip: Secondary | ICD-10-CM | POA: Diagnosis not present

## 2017-04-11 DIAGNOSIS — Z96642 Presence of left artificial hip joint: Secondary | ICD-10-CM | POA: Diagnosis not present

## 2017-04-16 DIAGNOSIS — Z96642 Presence of left artificial hip joint: Secondary | ICD-10-CM | POA: Diagnosis not present

## 2017-04-16 DIAGNOSIS — M25552 Pain in left hip: Secondary | ICD-10-CM | POA: Diagnosis not present

## 2017-04-16 DIAGNOSIS — R262 Difficulty in walking, not elsewhere classified: Secondary | ICD-10-CM | POA: Diagnosis not present

## 2017-04-19 DIAGNOSIS — M25552 Pain in left hip: Secondary | ICD-10-CM | POA: Diagnosis not present

## 2017-04-19 DIAGNOSIS — R262 Difficulty in walking, not elsewhere classified: Secondary | ICD-10-CM | POA: Diagnosis not present

## 2017-04-19 DIAGNOSIS — Z96642 Presence of left artificial hip joint: Secondary | ICD-10-CM | POA: Diagnosis not present

## 2017-04-23 DIAGNOSIS — Z96642 Presence of left artificial hip joint: Secondary | ICD-10-CM | POA: Diagnosis not present

## 2017-04-23 DIAGNOSIS — R262 Difficulty in walking, not elsewhere classified: Secondary | ICD-10-CM | POA: Diagnosis not present

## 2017-04-23 DIAGNOSIS — M25552 Pain in left hip: Secondary | ICD-10-CM | POA: Diagnosis not present

## 2017-04-25 DIAGNOSIS — M25552 Pain in left hip: Secondary | ICD-10-CM | POA: Diagnosis not present

## 2017-04-25 DIAGNOSIS — R262 Difficulty in walking, not elsewhere classified: Secondary | ICD-10-CM | POA: Diagnosis not present

## 2017-04-25 DIAGNOSIS — Z96642 Presence of left artificial hip joint: Secondary | ICD-10-CM | POA: Diagnosis not present

## 2017-05-01 DIAGNOSIS — R262 Difficulty in walking, not elsewhere classified: Secondary | ICD-10-CM | POA: Diagnosis not present

## 2017-05-01 DIAGNOSIS — Z96642 Presence of left artificial hip joint: Secondary | ICD-10-CM | POA: Diagnosis not present

## 2017-05-01 DIAGNOSIS — M25552 Pain in left hip: Secondary | ICD-10-CM | POA: Diagnosis not present

## 2017-05-03 DIAGNOSIS — M25552 Pain in left hip: Secondary | ICD-10-CM | POA: Diagnosis not present

## 2017-05-03 DIAGNOSIS — Z96642 Presence of left artificial hip joint: Secondary | ICD-10-CM | POA: Diagnosis not present

## 2017-05-03 DIAGNOSIS — R262 Difficulty in walking, not elsewhere classified: Secondary | ICD-10-CM | POA: Diagnosis not present

## 2017-05-08 ENCOUNTER — Ambulatory Visit (HOSPITAL_COMMUNITY)
Admission: RE | Admit: 2017-05-08 | Discharge: 2017-05-08 | Disposition: A | Discharge status: Home / Self Care | Payer: 59 | Source: Ambulatory Visit | Attending: Orthopaedic Surgery | Admitting: Orthopaedic Surgery

## 2017-05-08 ENCOUNTER — Other Ambulatory Visit (HOSPITAL_COMMUNITY): Payer: Self-pay | Admitting: Orthopaedic Surgery

## 2017-05-08 DIAGNOSIS — M79605 Pain in left leg: Secondary | ICD-10-CM

## 2017-05-08 DIAGNOSIS — M7989 Other specified soft tissue disorders: Secondary | ICD-10-CM | POA: Insufficient documentation

## 2017-05-08 DIAGNOSIS — Z96642 Presence of left artificial hip joint: Secondary | ICD-10-CM | POA: Diagnosis not present

## 2017-05-08 DIAGNOSIS — M79604 Pain in right leg: Secondary | ICD-10-CM

## 2017-05-08 DIAGNOSIS — M25552 Pain in left hip: Secondary | ICD-10-CM | POA: Diagnosis not present

## 2017-05-08 DIAGNOSIS — R262 Difficulty in walking, not elsewhere classified: Secondary | ICD-10-CM | POA: Diagnosis not present

## 2017-05-10 DIAGNOSIS — M25552 Pain in left hip: Secondary | ICD-10-CM | POA: Diagnosis not present

## 2017-05-10 DIAGNOSIS — Z96642 Presence of left artificial hip joint: Secondary | ICD-10-CM | POA: Diagnosis not present

## 2017-05-10 DIAGNOSIS — R262 Difficulty in walking, not elsewhere classified: Secondary | ICD-10-CM | POA: Diagnosis not present

## 2017-05-14 DIAGNOSIS — M25552 Pain in left hip: Secondary | ICD-10-CM | POA: Diagnosis not present

## 2017-05-14 DIAGNOSIS — Z96642 Presence of left artificial hip joint: Secondary | ICD-10-CM | POA: Diagnosis not present

## 2017-05-14 DIAGNOSIS — R262 Difficulty in walking, not elsewhere classified: Secondary | ICD-10-CM | POA: Diagnosis not present

## 2017-05-16 DIAGNOSIS — M25552 Pain in left hip: Secondary | ICD-10-CM | POA: Diagnosis not present

## 2017-05-16 DIAGNOSIS — R262 Difficulty in walking, not elsewhere classified: Secondary | ICD-10-CM | POA: Diagnosis not present

## 2017-05-16 DIAGNOSIS — Z96642 Presence of left artificial hip joint: Secondary | ICD-10-CM | POA: Diagnosis not present

## 2017-05-22 DIAGNOSIS — R262 Difficulty in walking, not elsewhere classified: Secondary | ICD-10-CM | POA: Diagnosis not present

## 2017-05-22 DIAGNOSIS — Z96642 Presence of left artificial hip joint: Secondary | ICD-10-CM | POA: Diagnosis not present

## 2017-05-22 DIAGNOSIS — M25552 Pain in left hip: Secondary | ICD-10-CM | POA: Diagnosis not present

## 2017-05-23 DIAGNOSIS — Z96642 Presence of left artificial hip joint: Secondary | ICD-10-CM | POA: Diagnosis not present

## 2017-05-24 DIAGNOSIS — M25552 Pain in left hip: Secondary | ICD-10-CM | POA: Diagnosis not present

## 2017-05-24 DIAGNOSIS — R262 Difficulty in walking, not elsewhere classified: Secondary | ICD-10-CM | POA: Diagnosis not present

## 2017-05-24 DIAGNOSIS — Z96642 Presence of left artificial hip joint: Secondary | ICD-10-CM | POA: Diagnosis not present

## 2017-05-29 DIAGNOSIS — M25552 Pain in left hip: Secondary | ICD-10-CM | POA: Diagnosis not present

## 2017-05-29 DIAGNOSIS — R262 Difficulty in walking, not elsewhere classified: Secondary | ICD-10-CM | POA: Diagnosis not present

## 2017-05-29 DIAGNOSIS — Z96642 Presence of left artificial hip joint: Secondary | ICD-10-CM | POA: Diagnosis not present

## 2017-05-31 DIAGNOSIS — M25552 Pain in left hip: Secondary | ICD-10-CM | POA: Diagnosis not present

## 2017-05-31 DIAGNOSIS — R262 Difficulty in walking, not elsewhere classified: Secondary | ICD-10-CM | POA: Diagnosis not present

## 2017-05-31 DIAGNOSIS — Z96642 Presence of left artificial hip joint: Secondary | ICD-10-CM | POA: Diagnosis not present

## 2017-06-20 DIAGNOSIS — M25552 Pain in left hip: Secondary | ICD-10-CM | POA: Diagnosis not present

## 2017-06-20 DIAGNOSIS — Z96642 Presence of left artificial hip joint: Secondary | ICD-10-CM | POA: Diagnosis not present

## 2017-08-07 DIAGNOSIS — Z0000 Encounter for general adult medical examination without abnormal findings: Secondary | ICD-10-CM | POA: Diagnosis not present

## 2017-09-13 DIAGNOSIS — E890 Postprocedural hypothyroidism: Secondary | ICD-10-CM | POA: Diagnosis not present

## 2017-09-26 DIAGNOSIS — M1611 Unilateral primary osteoarthritis, right hip: Secondary | ICD-10-CM | POA: Diagnosis not present

## 2017-10-01 ENCOUNTER — Other Ambulatory Visit: Payer: Self-pay | Admitting: Orthopaedic Surgery

## 2017-10-09 DIAGNOSIS — Z01419 Encounter for gynecological examination (general) (routine) without abnormal findings: Secondary | ICD-10-CM | POA: Diagnosis not present

## 2017-10-10 DIAGNOSIS — D229 Melanocytic nevi, unspecified: Secondary | ICD-10-CM | POA: Diagnosis not present

## 2017-10-10 DIAGNOSIS — L821 Other seborrheic keratosis: Secondary | ICD-10-CM | POA: Diagnosis not present

## 2017-10-10 DIAGNOSIS — D485 Neoplasm of uncertain behavior of skin: Secondary | ICD-10-CM | POA: Diagnosis not present

## 2017-10-17 DIAGNOSIS — M8588 Other specified disorders of bone density and structure, other site: Secondary | ICD-10-CM | POA: Diagnosis not present

## 2017-10-17 DIAGNOSIS — M810 Age-related osteoporosis without current pathological fracture: Secondary | ICD-10-CM | POA: Diagnosis not present

## 2017-10-18 NOTE — Pre-Procedure Instructions (Signed)
Martin Riddle  10/18/2017      [id number] - [place], [place] - [address] AT [address] [place] [address] Phone: [phone] Fax: [phone]    Your procedure is scheduled on Tuesday September 10.  Report to [facility] Admitting at 5:30 A.M.  Call this number if you have problems the morning of surgery:  [phone]   Remember:  Do not eat or drink after midnight.    Take these medicines the morning of surgery with A SIP OF WATER: Levothyroxine (synthroid)  7 days prior to surgery STOP taking any Aleve, Naproxen, Ibuprofen, Motrin, Advil, Goody's, BC's, all herbal medications, fish oil, and all vitamins  Follow your surgeon's instructions on stopping Aspirin.      Do not wear jewelry, make-up or nail polish.  Do not wear lotions, powders, or perfumes, or deodorant.  Do not shave 48 hours prior to surgery.  Men may shave face and neck.  Do not bring valuables to the hospital.  [facility] is not responsible for any belongings or valuables.  Contacts, dentures or bridgework may not be worn into surgery.  Leave your suitcase in the car.  After surgery it may be brought to your room.  For patients admitted to the hospital, discharge time will be determined by your treatment team.  Patients discharged the day of surgery will not be allowed to drive home.   Special instructions:    - Preparing For Surgery  Before surgery, you can play an important role. Because skin is not sterile, your skin needs to be as free of germs as possible. You can reduce the number of germs on your skin by washing with CHG (chlorahexidine gluconate) Soap before surgery.  CHG is an antiseptic cleaner which kills germs and bonds with the skin to continue killing germs even after washing.    Oral Hygiene is also important to reduce your risk of infection.  Remember - BRUSH YOUR TEETH THE MORNING OF SURGERY WITH YOUR  REGULAR TOOTHPASTE  Please do not use if you have an allergy to CHG or antibacterial soaps. If your skin becomes reddened/irritated stop using the CHG.  Do not shave (including legs and underarms) for at least 48 hours prior to first CHG shower. It is OK to shave your face.  Please follow these instructions carefully.   1. Shower the NIGHT BEFORE SURGERY and the MORNING OF SURGERY with CHG.   2. If you chose to wash your hair, wash your hair first as usual with your normal shampoo.  3. After you shampoo, rinse your hair and body thoroughly to remove the shampoo.  4. Use CHG as you would any other liquid soap. You can apply CHG directly to the skin and wash gently with a scrungie or a clean washcloth.   5. Apply the CHG Soap to your body ONLY FROM THE NECK DOWN.  Do not use on open wounds or open sores. Avoid contact with your eyes, ears, mouth and genitals (private parts). Wash Face and genitals (private parts)  with your normal soap.  6. Wash thoroughly, paying special attention to the area where your surgery will be performed.  7. Thoroughly rinse your body with warm water from the neck down.  8. DO NOT shower/wash with your normal soap after using and rinsing off the CHG Soap.  9. Pat yourself dry with a CLEAN TOWEL.  10. Wear CLEAN PAJAMAS to bed the night before  surgery, wear comfortable clothes the morning of surgery  11. Place CLEAN SHEETS on your bed the night of your first shower and DO NOT SLEEP WITH PETS.    Day of Surgery:  Do not apply any deodorants/lotions.  Please wear clean clothes to the hospital/surgery center.   Remember to brush your teeth WITH YOUR REGULAR TOOTHPASTE.    Please read over the following fact sheets that you were given. Coughing and Deep Breathing, Total Joint Packet, MRSA Information and Surgical Site Infection Prevention

## 2017-10-19 ENCOUNTER — Encounter (HOSPITAL_COMMUNITY): Payer: Self-pay

## 2017-10-19 ENCOUNTER — Other Ambulatory Visit: Payer: Self-pay

## 2017-10-19 ENCOUNTER — Encounter (HOSPITAL_COMMUNITY)
Admission: RE | Admit: 2017-10-19 | Discharge: 2017-10-19 | Disposition: A | Discharge status: Home / Self Care | Payer: 59 | Source: Ambulatory Visit | Attending: Orthopaedic Surgery | Admitting: Orthopaedic Surgery

## 2017-10-19 DIAGNOSIS — M1611 Unilateral primary osteoarthritis, right hip: Secondary | ICD-10-CM | POA: Diagnosis not present

## 2017-10-19 DIAGNOSIS — Z01818 Encounter for other preprocedural examination: Secondary | ICD-10-CM | POA: Diagnosis not present

## 2017-10-19 DIAGNOSIS — Z01810 Encounter for preprocedural cardiovascular examination: Secondary | ICD-10-CM | POA: Insufficient documentation

## 2017-10-19 HISTORY — DX: Hypothyroidism, unspecified: E03.9

## 2017-10-19 LAB — URINALYSIS, ROUTINE W REFLEX MICROSCOPIC
BILIRUBIN URINE: NEGATIVE
Glucose, UA: NEGATIVE mg/dL
Ketones, ur: NEGATIVE mg/dL
Leukocytes, UA: NEGATIVE
NITRITE: NEGATIVE
PH: 7 (ref 5.0–8.0)
Protein, ur: NEGATIVE mg/dL
SPECIFIC GRAVITY, URINE: 1.004 — AB (ref 1.005–1.030)

## 2017-10-19 LAB — TYPE AND SCREEN
ABO/RH(D): O NEG
ANTIBODY SCREEN: NEGATIVE

## 2017-10-19 LAB — CBC WITH DIFFERENTIAL/PLATELET
ABS IMMATURE GRANULOCYTES: 0 10*3/uL (ref 0.0–0.1)
BASOS PCT: 1 %
Basophils Absolute: 0 10*3/uL (ref 0.0–0.1)
EOS PCT: 1 %
Eosinophils Absolute: 0.1 10*3/uL (ref 0.0–0.7)
HCT: 42.5 % (ref 36.0–46.0)
HEMOGLOBIN: 13.4 g/dL (ref 12.0–15.0)
Immature Granulocytes: 0 %
LYMPHS PCT: 21 %
Lymphs Abs: 1.2 10*3/uL (ref 0.7–4.0)
MCH: 27.3 pg (ref 26.0–34.0)
MCHC: 31.5 g/dL (ref 30.0–36.0)
MCV: 86.6 fL (ref 78.0–100.0)
MONO ABS: 0.5 10*3/uL (ref 0.1–1.0)
MONOS PCT: 8 %
NEUTROS ABS: 4.1 10*3/uL (ref 1.7–7.7)
Neutrophils Relative %: 69 %
Platelets: 279 10*3/uL (ref 150–400)
RBC: 4.91 MIL/uL (ref 3.87–5.11)
RDW: 13.7 % (ref 11.5–15.5)
WBC: 6 10*3/uL (ref 4.0–10.5)

## 2017-10-19 LAB — BASIC METABOLIC PANEL
Anion gap: 7 (ref 5–15)
BUN: 13 mg/dL (ref 6–20)
CALCIUM: 9.3 mg/dL (ref 8.9–10.3)
CO2: 29 mmol/L (ref 22–32)
Chloride: 107 mmol/L (ref 98–111)
Creatinine, Ser: 0.73 mg/dL (ref 0.44–1.00)
GFR calc Af Amer: >60 mL/min (ref >60)
GFR calc non Af Amer: >60 mL/min (ref >60)
GLUCOSE: 97 mg/dL (ref 70–99)
Potassium: 4 mmol/L (ref 3.5–5.1)
Sodium: 143 mmol/L (ref 135–145)

## 2017-10-19 LAB — SURGICAL PCR SCREEN
MRSA, PCR: NEGATIVE
STAPHYLOCOCCUS AUREUS: NEGATIVE

## 2017-10-19 LAB — PROTIME-INR
INR: 1.02
Prothrombin Time: 13.3 seconds (ref 11.4–15.2)

## 2017-10-19 LAB — APTT: APTT: 28 s (ref 24–36)

## 2017-10-19 NOTE — Progress Notes (Signed)
PCP  Lavone Orn MD  Denies any cardiac history. Or testing

## 2017-10-25 NOTE — H&P (Signed)
TOTAL HIP ADMISSION H&P  Patient is admitted for right total hip arthroplasty.  Subjective:  Chief Complaint: right hip pain  HPI: Brandi Mitchell, 57 y.o. female, has a history of pain and functional disability in the right hip(s) due to arthritis and patient has failed non-surgical conservative treatments for greater than 12 weeks to include NSAID's and/or analgesics, flexibility and strengthening excercises, supervised PT with diminished ADL's post treatment, use of assistive devices, weight reduction as appropriate and activity modification.  Onset of symptoms was gradual starting 5 years ago with gradually worsening course since that time.The patient noted no past surgery on the right hip(s).  Patient currently rates pain in the right hip at 10 out of 10 with activity. Patient has night pain, worsening of pain with activity and weight bearing, trendelenberg gait, pain that interfers with activities of daily living and crepitus. Patient has evidence of subchondral cysts, subchondral sclerosis, periarticular osteophytes and joint space narrowing by imaging studies. This condition presents safety issues increasing the risk of falls. There is no current active infection.  Patient Active Problem List   Diagnosis Date Noted  . Primary osteoarthritis of left hip 03/13/2017   Past Medical History:  Diagnosis Date  . Degenerative joint disease (DJD) of hip    left hip  . Heart murmur    "I have had it all my life but no issues with it"  . Hypothyroidism   . Migraine headache   . Thyroid disease     Past Surgical History:  Procedure Laterality Date  . ABDOMINAL HYSTERECTOMY    . BREAST EXCISIONAL BIOPSY Left   . CESAREAN SECTION    . FOOT SURGERY    . TMJ ARTHROPLASTY    . TONSILLECTOMY    . TOTAL HIP ARTHROPLASTY Left 03/13/2017   Procedure: TOTAL HIP ARTHROPLASTY ANTERIOR APPROACH;  Surgeon: Melrose Nakayama, MD;  Location: [place];  Service: Orthopedics;  Laterality: Left;    No current  facility-administered medications for this encounter.    Current Outpatient Medications  Medication Sig Dispense Refill Last Dose  . Calcium Carb-Cholecalciferol (CALCIUM 600 + D PO) Take 1 tablet by mouth daily.     Marland Kitchen levothyroxine (SYNTHROID, LEVOTHROID) 75 MCG tablet Take 75 mcg by mouth daily before breakfast.   03/13/2017 at 0500  . vitamin B-12 (CYANOCOBALAMIN) 1000 MCG tablet Take 1,000 mcg by mouth daily.     Marland Kitchen aspirin EC 325 MG EC tablet Take 1 tablet (325 mg total) by mouth 2 (two) times daily after a meal. (Patient not taking: Reported on 10/11/2017) 60 tablet 0 Not Taking at Unknown time  . bisacodyl (DULCOLAX) 5 MG EC tablet Take 1 tablet (5 mg total) by mouth daily as needed for moderate constipation. (Patient not taking: Reported on 10/11/2017) 10 tablet 0 Not Taking at Unknown time  . docusate sodium (COLACE) 100 MG capsule Take 1 capsule (100 mg total) by mouth 2 (two) times daily. (Patient not taking: Reported on 10/11/2017) 30 capsule 0 Not Taking at Unknown time  . HYDROcodone-acetaminophen (NORCO/VICODIN) 5-325 MG tablet Take 1-2 tablets by mouth every 4 (four) hours as needed for severe pain ((score 7 to 10)). (Patient not taking: Reported on 10/11/2017) 40 tablet 0 Not Taking at Unknown time  . promethazine (PHENERGAN) 12.5 MG tablet Take 1-2 tablets (12.5-25 mg total) by mouth every 6 (six) hours as needed for nausea or vomiting. (Patient not taking: Reported on 10/11/2017) 30 tablet 0 Not Taking at Unknown time  . tiZANidine (ZANAFLEX) 4 MG  tablet Take 1 tablet (4 mg total) by mouth 3 (three) times daily. (Patient not taking: Reported on 10/11/2017) 40 tablet 1 Not Taking at Unknown time   Allergies  Allergen Reactions  . Indomethacin Nausea Only  . Keflex [Cephalexin] Nausea Only  . Percocet [Oxycodone-Acetaminophen] Nausea Only  . Tramadol Nausea Only    Social History   Tobacco Use  . Smoking status: Never Smoker  . Smokeless tobacco: Never Used  Substance Use Topics   . Alcohol use: No    No family history on file.   Review of Systems  Musculoskeletal: Positive for joint pain.       Right hip pain  All other systems reviewed and are negative.   Objective:  Physical Exam  Constitutional: She is oriented to person, place, and time. She appears well-developed and well-nourished.  HENT:  Head: Normocephalic and atraumatic.  Eyes: Pupils are equal, round, and reactive to light.  Neck: Normal range of motion.  Cardiovascular: Normal rate and regular rhythm.  Respiratory: Effort normal and breath sounds normal.  GI: Soft.  Musculoskeletal:  Right hip motion is a bit limited and painful in internal rotation.  This leg does seem slightly short compared to the opposite side.  Opposite hip has full painless range of motion.  Sensation and motor function are intact in her feet.   Neurological: She is alert and oriented to person, place, and time.  Skin: Skin is warm and dry.  Psychiatric: She has a normal mood and affect. Her behavior is normal. Judgment and thought content normal.    Vital signs in last 24 hours:    Labs:   Estimated body mass index is 22.64 kg/m as calculated from the following:   Height as of 10/19/17: 5\' 1"  (1.549 m).   Weight as of 10/19/17: 54.3 kg.   Imaging Review Plain radiographs demonstrate severe degenerative joint disease of the right hip(s). The bone quality appears to be good for age and reported activity level.    Preoperative templating of the joint replacement has been completed, documented, and submitted to the Operating Room personnel in order to optimize intra-operative equipment management.     Assessment/Plan:  End stage primary arthritis, right hip(s)  The patient history, physical examination, clinical judgement of the provider and imaging studies are consistent with end stage degenerative joint disease of the right hip(s) and total hip arthroplasty is deemed medically necessary. The treatment  options including medical management, injection therapy, arthroscopy and arthroplasty were discussed at length. The risks and benefits of total hip arthroplasty were presented and reviewed. The risks due to aseptic loosening, infection, stiffness, dislocation/subluxation,  thromboembolic complications and other imponderables were discussed.  The patient acknowledged the explanation, agreed to proceed with the plan and consent was signed. Patient is being admitted for inpatient treatment for surgery, pain control, PT, OT, prophylactic antibiotics, VTE prophylaxis, progressive ambulation and ADL's and discharge planning.The patient is planning to be discharged home with home health services

## 2017-10-29 MED ORDER — BUPIVACAINE LIPOSOME 1.3 % IJ SUSP
10.0000 mL | Freq: Once | INTRAMUSCULAR | Status: AC
  Administered 2017-10-30: 20 mL
  Filled 2017-10-29: qty 10

## 2017-10-29 MED ORDER — TRANEXAMIC ACID 1000 MG/10ML IV SOLN
1000.0000 mg | INTRAVENOUS | Status: AC
  Administered 2017-10-30: 1000 mg via INTRAVENOUS
  Filled 2017-10-29: qty 1000

## 2017-10-29 MED ORDER — TRANEXAMIC ACID 1000 MG/10ML IV SOLN
2000.0000 mg | INTRAVENOUS | Status: AC
  Administered 2017-10-30: 2000 mg via TOPICAL
  Filled 2017-10-29: qty 20

## 2017-10-30 ENCOUNTER — Encounter (HOSPITAL_COMMUNITY): Payer: Self-pay | Admitting: *Deleted

## 2017-10-30 ENCOUNTER — Inpatient Hospital Stay (HOSPITAL_COMMUNITY)
Admission: RE | Admit: 2017-10-30 | Discharge: 2017-10-31 | DRG: 470 | Disposition: A | Discharge status: Home Health | Payer: 59 | Source: Ambulatory Visit | Attending: Orthopaedic Surgery | Admitting: Orthopaedic Surgery

## 2017-10-30 ENCOUNTER — Encounter (HOSPITAL_COMMUNITY)
Admission: RE | Disposition: A | Discharge status: Home Health | Payer: Self-pay | Source: Ambulatory Visit | Attending: Orthopaedic Surgery

## 2017-10-30 ENCOUNTER — Inpatient Hospital Stay (HOSPITAL_COMMUNITY): Payer: 59 | Admitting: Anesthesiology

## 2017-10-30 ENCOUNTER — Other Ambulatory Visit: Payer: Self-pay

## 2017-10-30 ENCOUNTER — Inpatient Hospital Stay (HOSPITAL_COMMUNITY): Payer: 59

## 2017-10-30 DIAGNOSIS — Z881 Allergy status to other antibiotic agents status: Secondary | ICD-10-CM

## 2017-10-30 DIAGNOSIS — Z885 Allergy status to narcotic agent status: Secondary | ICD-10-CM

## 2017-10-30 DIAGNOSIS — Z419 Encounter for procedure for purposes other than remedying health state, unspecified: Secondary | ICD-10-CM

## 2017-10-30 DIAGNOSIS — Z96642 Presence of left artificial hip joint: Secondary | ICD-10-CM | POA: Diagnosis present

## 2017-10-30 DIAGNOSIS — M1611 Unilateral primary osteoarthritis, right hip: Secondary | ICD-10-CM | POA: Diagnosis not present

## 2017-10-30 DIAGNOSIS — E039 Hypothyroidism, unspecified: Secondary | ICD-10-CM | POA: Diagnosis not present

## 2017-10-30 DIAGNOSIS — Z79899 Other long term (current) drug therapy: Secondary | ICD-10-CM

## 2017-10-30 DIAGNOSIS — Z7982 Long term (current) use of aspirin: Secondary | ICD-10-CM

## 2017-10-30 DIAGNOSIS — Z888 Allergy status to other drugs, medicaments and biological substances status: Secondary | ICD-10-CM | POA: Diagnosis not present

## 2017-10-30 DIAGNOSIS — Z471 Aftercare following joint replacement surgery: Secondary | ICD-10-CM | POA: Diagnosis not present

## 2017-10-30 DIAGNOSIS — Z96641 Presence of right artificial hip joint: Secondary | ICD-10-CM | POA: Diagnosis not present

## 2017-10-30 HISTORY — PX: TOTAL HIP ARTHROPLASTY: SHX124

## 2017-10-30 SURGERY — ARTHROPLASTY, HIP, TOTAL, ANTERIOR APPROACH
Anesthesia: General | Site: Hip | Laterality: Right

## 2017-10-30 MED ORDER — LACTATED RINGERS IV SOLN
INTRAVENOUS | Status: DC
  Administered 2017-10-30: 16:00:00 via INTRAVENOUS
  Administered 2017-10-30: 75 mL/h via INTRAVENOUS

## 2017-10-30 MED ORDER — BUPIVACAINE-EPINEPHRINE (PF) 0.25% -1:200000 IJ SOLN
INTRAMUSCULAR | Status: AC
  Filled 2017-10-30: qty 30

## 2017-10-30 MED ORDER — LACTATED RINGERS IV SOLN
INTRAVENOUS | Status: DC
  Administered 2017-10-30: 06:00:00 via INTRAVENOUS

## 2017-10-30 MED ORDER — KETOROLAC TROMETHAMINE 15 MG/ML IJ SOLN
15.0000 mg | Freq: Four times a day (QID) | INTRAMUSCULAR | Status: AC
  Administered 2017-10-30 – 2017-10-31 (×4): 15 mg via INTRAVENOUS
  Filled 2017-10-30 (×3): qty 1

## 2017-10-30 MED ORDER — MIDAZOLAM HCL 2 MG/2ML IJ SOLN
INTRAMUSCULAR | Status: AC
  Filled 2017-10-30: qty 2

## 2017-10-30 MED ORDER — FENTANYL CITRATE (PF) 100 MCG/2ML IJ SOLN
INTRAMUSCULAR | Status: DC | PRN
  Administered 2017-10-30: 50 ug via INTRAVENOUS

## 2017-10-30 MED ORDER — VANCOMYCIN HCL IN DEXTROSE 1-5 GM/200ML-% IV SOLN
1000.0000 mg | INTRAVENOUS | Status: AC
  Administered 2017-10-30: 1000 mg via INTRAVENOUS

## 2017-10-30 MED ORDER — METOCLOPRAMIDE HCL 5 MG/ML IJ SOLN: 5.0000 mg | Freq: Three times a day (TID) | INTRAMUSCULAR | Status: DC | PRN

## 2017-10-30 MED ORDER — HYDROCODONE-ACETAMINOPHEN 5-325 MG PO TABS
1.0000 | ORAL_TABLET | ORAL | Status: DC | PRN
  Administered 2017-10-30: 1 via ORAL
  Filled 2017-10-30 (×2): qty 1

## 2017-10-30 MED ORDER — BUPIVACAINE-EPINEPHRINE (PF) 0.25% -1:200000 IJ SOLN
INTRAMUSCULAR | Status: DC | PRN
  Administered 2017-10-30: 30 mL via PERINEURAL

## 2017-10-30 MED ORDER — HYDROCODONE-ACETAMINOPHEN 7.5-325 MG PO TABS: 1.0000 | ORAL_TABLET | ORAL | Status: DC | PRN

## 2017-10-30 MED ORDER — ACETAMINOPHEN 325 MG PO TABS: 325.0000 mg | ORAL_TABLET | Freq: Four times a day (QID) | ORAL | Status: DC | PRN

## 2017-10-30 MED ORDER — METHOCARBAMOL 1000 MG/10ML IJ SOLN
500.0000 mg | Freq: Four times a day (QID) | INTRAVENOUS | Status: DC | PRN
  Filled 2017-10-30 (×2): qty 5

## 2017-10-30 MED ORDER — ONDANSETRON HCL 4 MG PO TABS: 4.0000 mg | ORAL_TABLET | Freq: Four times a day (QID) | ORAL | Status: DC | PRN

## 2017-10-30 MED ORDER — ONDANSETRON HCL 4 MG/2ML IJ SOLN: 4.0000 mg | Freq: Four times a day (QID) | INTRAMUSCULAR | Status: DC | PRN

## 2017-10-30 MED ORDER — LIDOCAINE 2% (20 MG/ML) 5 ML SYRINGE
INTRAMUSCULAR | Status: AC
  Filled 2017-10-30: qty 5

## 2017-10-30 MED ORDER — PROPOFOL 500 MG/50ML IV EMUL
INTRAVENOUS | Status: DC | PRN
  Administered 2017-10-30: 75 ug/kg/min via INTRAVENOUS

## 2017-10-30 MED ORDER — ALUM & MAG HYDROXIDE-SIMETH 200-200-20 MG/5ML PO SUSP: 30.0000 mL | ORAL | Status: DC | PRN

## 2017-10-30 MED ORDER — METOCLOPRAMIDE HCL 5 MG PO TABS: 5.0000 mg | ORAL_TABLET | Freq: Three times a day (TID) | ORAL | Status: DC | PRN

## 2017-10-30 MED ORDER — PROMETHAZINE HCL 25 MG PO TABS: 12.5000 mg | ORAL_TABLET | Freq: Four times a day (QID) | ORAL | Status: DC | PRN

## 2017-10-30 MED ORDER — ASPIRIN EC 325 MG PO TBEC: 325.0000 mg | DELAYED_RELEASE_TABLET | Freq: Two times a day (BID) | ORAL | Status: DC

## 2017-10-30 MED ORDER — MIDAZOLAM HCL 5 MG/5ML IJ SOLN
INTRAMUSCULAR | Status: DC | PRN
  Administered 2017-10-30: 2 mg via INTRAVENOUS

## 2017-10-30 MED ORDER — METHOCARBAMOL 500 MG PO TABS
500.0000 mg | ORAL_TABLET | Freq: Four times a day (QID) | ORAL | Status: DC | PRN
  Administered 2017-10-30 (×2): 500 mg via ORAL
  Filled 2017-10-30 (×2): qty 1

## 2017-10-30 MED ORDER — 0.9 % SODIUM CHLORIDE (POUR BTL) OPTIME
TOPICAL | Status: DC | PRN
  Administered 2017-10-30: 1000 mL

## 2017-10-30 MED ORDER — DOCUSATE SODIUM 100 MG PO CAPS
100.0000 mg | ORAL_CAPSULE | Freq: Two times a day (BID) | ORAL | Status: DC
  Administered 2017-10-30 – 2017-10-31 (×2): 100 mg via ORAL
  Filled 2017-10-30 (×2): qty 1

## 2017-10-30 MED ORDER — PROPOFOL 10 MG/ML IV BOLUS
INTRAVENOUS | Status: DC | PRN
  Administered 2017-10-30 (×2): 20 mg via INTRAVENOUS

## 2017-10-30 MED ORDER — FENTANYL CITRATE (PF) 250 MCG/5ML IJ SOLN
INTRAMUSCULAR | Status: AC
  Filled 2017-10-30: qty 5

## 2017-10-30 MED ORDER — ONDANSETRON HCL 4 MG/2ML IJ SOLN
INTRAMUSCULAR | Status: AC
  Filled 2017-10-30: qty 2

## 2017-10-30 MED ORDER — FENTANYL CITRATE (PF) 100 MCG/2ML IJ SOLN: 25.0000 ug | INTRAMUSCULAR | Status: DC | PRN

## 2017-10-30 MED ORDER — TRANEXAMIC ACID 1000 MG/10ML IV SOLN: 1000.0000 mg | INTRAVENOUS | Status: DC

## 2017-10-30 MED ORDER — PROMETHAZINE HCL 25 MG/ML IJ SOLN: 6.2500 mg | Freq: Four times a day (QID) | INTRAMUSCULAR | Status: DC | PRN

## 2017-10-30 MED ORDER — KETOROLAC TROMETHAMINE 15 MG/ML IJ SOLN
INTRAMUSCULAR | Status: AC
  Filled 2017-10-30: qty 1

## 2017-10-30 MED ORDER — VANCOMYCIN HCL IN DEXTROSE 1-5 GM/200ML-% IV SOLN
1000.0000 mg | Freq: Two times a day (BID) | INTRAVENOUS | Status: AC
  Administered 2017-10-30: 1000 mg via INTRAVENOUS
  Filled 2017-10-30: qty 200

## 2017-10-30 MED ORDER — MENTHOL 3 MG MT LOZG: 1.0000 | LOZENGE | OROMUCOSAL | Status: DC | PRN

## 2017-10-30 MED ORDER — LEVOTHYROXINE SODIUM 75 MCG PO TABS
75.0000 ug | ORAL_TABLET | Freq: Every day | ORAL | Status: DC
  Administered 2017-10-31: 75 ug via ORAL
  Filled 2017-10-30: qty 1

## 2017-10-30 MED ORDER — ACETAMINOPHEN 500 MG PO TABS
500.0000 mg | ORAL_TABLET | Freq: Four times a day (QID) | ORAL | Status: AC
  Administered 2017-10-30 – 2017-10-31 (×4): 500 mg via ORAL
  Filled 2017-10-30 (×4): qty 1

## 2017-10-30 MED ORDER — PHENYLEPHRINE 40 MCG/ML (10ML) SYRINGE FOR IV PUSH (FOR BLOOD PRESSURE SUPPORT)
PREFILLED_SYRINGE | INTRAVENOUS | Status: DC | PRN
  Administered 2017-10-30: 80 ug via INTRAVENOUS

## 2017-10-30 MED ORDER — MORPHINE SULFATE (PF) 2 MG/ML IV SOLN: 0.5000 mg | INTRAVENOUS | Status: DC | PRN

## 2017-10-30 MED ORDER — TRANEXAMIC ACID 1000 MG/10ML IV SOLN
1000.0000 mg | Freq: Once | INTRAVENOUS | Status: AC
  Administered 2017-10-30: 1000 mg via INTRAVENOUS
  Filled 2017-10-30: qty 10

## 2017-10-30 MED ORDER — DIPHENHYDRAMINE HCL 12.5 MG/5ML PO ELIX: 12.5000 mg | ORAL_SOLUTION | ORAL | Status: DC | PRN

## 2017-10-30 MED ORDER — BISACODYL 5 MG PO TBEC: 5.0000 mg | DELAYED_RELEASE_TABLET | Freq: Every day | ORAL | Status: DC | PRN

## 2017-10-30 MED ORDER — PROPOFOL 10 MG/ML IV BOLUS
INTRAVENOUS | Status: AC
  Filled 2017-10-30: qty 20

## 2017-10-30 MED ORDER — CHLORHEXIDINE GLUCONATE 4 % EX LIQD: 60.0000 mL | Freq: Once | CUTANEOUS | Status: DC

## 2017-10-30 MED ORDER — PHENOL 1.4 % MT LIQD: 1.0000 | OROMUCOSAL | Status: DC | PRN

## 2017-10-30 MED ORDER — APIXABAN 2.5 MG PO TABS
2.5000 mg | ORAL_TABLET | Freq: Two times a day (BID) | ORAL | Status: DC
  Administered 2017-10-31: 2.5 mg via ORAL
  Filled 2017-10-30: qty 1

## 2017-10-30 MED ORDER — ONDANSETRON HCL 4 MG/2ML IJ SOLN
INTRAMUSCULAR | Status: DC | PRN
  Administered 2017-10-30: 4 mg via INTRAVENOUS

## 2017-10-30 SURGICAL SUPPLY — 46 items
BLADE SAW SGTL 18X1.27X75 (BLADE) ×2 IMPLANT
BLADE SAW SGTL 18X1.27X75MM (BLADE) ×1
BLADE SURG 10 STRL SS (BLADE) ×6 IMPLANT
CELLS DAT CNTRL 66122 CELL SVR (MISCELLANEOUS) ×1 IMPLANT
COVER PERINEAL POST (MISCELLANEOUS) ×3 IMPLANT
COVER SURGICAL LIGHT HANDLE (MISCELLANEOUS) ×3 IMPLANT
CUP GRIPTON 48MM 100 HIP (Hips) ×3 IMPLANT
DRAPE C-ARM 42X72 X-RAY (DRAPES) ×3 IMPLANT
DRAPE STERI IOBAN 125X83 (DRAPES) ×3 IMPLANT
DRAPE U-SHAPE 47X51 STRL (DRAPES) ×9 IMPLANT
DRSG AQUACEL AG ADV 3.5X10 (GAUZE/BANDAGES/DRESSINGS) ×3 IMPLANT
DRSG OPSITE POSTOP 4X6 (GAUZE/BANDAGES/DRESSINGS) ×3 IMPLANT
DURAPREP 26ML APPLICATOR (WOUND CARE) ×3 IMPLANT
ELECT BLADE 4.0 EZ CLEAN MEGAD (MISCELLANEOUS) ×3
ELECT REM PT RETURN 9FT ADLT (ELECTROSURGICAL) ×3
ELECTRODE BLDE 4.0 EZ CLN MEGD (MISCELLANEOUS) ×1 IMPLANT
ELECTRODE REM PT RTRN 9FT ADLT (ELECTROSURGICAL) ×1 IMPLANT
ELIMINATOR HOLE APEX DEPUY (Hips) ×3 IMPLANT
FACESHIELD WRAPAROUND (MASK) ×12 IMPLANT
GLOVE BIO SURGEON STRL SZ8 (GLOVE) ×6 IMPLANT
GLOVE BIOGEL PI IND STRL 8 (GLOVE) ×2 IMPLANT
GLOVE BIOGEL PI INDICATOR 8 (GLOVE) ×4
GOWN STRL REUS W/ TWL LRG LVL3 (GOWN DISPOSABLE) ×1 IMPLANT
GOWN STRL REUS W/ TWL XL LVL3 (GOWN DISPOSABLE) ×2 IMPLANT
GOWN STRL REUS W/TWL LRG LVL3 (GOWN DISPOSABLE) ×2
GOWN STRL REUS W/TWL XL LVL3 (GOWN DISPOSABLE) ×4
HEAD FEMORAL 32 CERAMIC (Hips) ×3 IMPLANT
KIT BASIN OR (CUSTOM PROCEDURE TRAY) ×3 IMPLANT
KIT TURNOVER KIT B (KITS) ×3 IMPLANT
MANIFOLD NEPTUNE II (INSTRUMENTS) ×3 IMPLANT
NEEDLE HYPO 22GX1.5 SAFETY (NEEDLE) ×3 IMPLANT
NS IRRIG 1000ML POUR BTL (IV SOLUTION) ×3 IMPLANT
PACK TOTAL JOINT (CUSTOM PROCEDURE TRAY) ×3 IMPLANT
PAD ARMBOARD 7.5X6 YLW CONV (MISCELLANEOUS) ×3 IMPLANT
PINN ALTRX NEUT ID X OD 32X48 ×3 IMPLANT
RTRCTR WOUND ALEXIS 18CM MED (MISCELLANEOUS) ×3
STEM CORAIL KA12 (Stem) ×3 IMPLANT
SUT ETHIBOND NAB CT1 #1 30IN (SUTURE) ×6 IMPLANT
SUT VIC AB 1 CT1 27 (SUTURE) ×2
SUT VIC AB 1 CT1 27XBRD ANBCTR (SUTURE) ×1 IMPLANT
SUT VIC AB 2-0 CT1 27 (SUTURE) ×2
SUT VIC AB 2-0 CT1 TAPERPNT 27 (SUTURE) ×1 IMPLANT
SUT VIC AB 3-0 PS2 18 (SUTURE) ×2
SUT VIC AB 3-0 PS2 18XBRD (SUTURE) ×1 IMPLANT
SUT VLOC 180 0 24IN GS25 (SUTURE) ×3 IMPLANT
SYR 50ML LL SCALE MARK (SYRINGE) ×3 IMPLANT

## 2017-10-30 NOTE — Op Note (Signed)

## 2017-10-30 NOTE — Interval H&P Note (Signed)
History and Physical Interval Note:  10/30/2017 7:22 AM  Brandi Mitchell  has presented today for surgery, with the diagnosis of RIGHT HIP DEGENERATIVE JOINT DISEASE  The various methods of treatment have been discussed with the patient and family. After consideration of risks, benefits and other options for treatment, the patient has consented to  Procedure(s): TOTAL HIP ARTHROPLASTY ANTERIOR APPROACH (Right) as a surgical intervention .  The patient's history has been reviewed, patient examined, no change in status, stable for surgery.  I have reviewed the patient's chart and labs.  Questions were answered to the patient's satisfaction.     Fread Kottke G

## 2017-10-30 NOTE — Evaluation (Signed)
Physical Therapy Evaluation Patient Details Name: Brandi Mitchell MRN: [id number] DOB: January 01, 1961 Today's Date: 10/30/2017   History of Present Illness  Pt is a 57 y/o female s/p elective R THA, direct anterior. PMH includes L THA, and migranes.   Clinical Impression  Pt is s/p surgery above with deficits below. Pt tolerated gait training very well this session and required min guard A with use of RW. Educated about RLE HEP. Will continue to follow acutely to maximize functional mobility independence and safety.     Follow Up Recommendations Follow surgeon's recommendation for DC plan and follow-up therapies;Supervision for mobility/OOB    Equipment Recommendations  None recommended by PT    Recommendations for Other Services       Precautions / Restrictions Precautions Precautions: None Restrictions Weight Bearing Restrictions: Yes RLE Weight Bearing: Weight bearing as tolerated      Mobility  Bed Mobility Overal bed mobility: Needs Assistance Bed Mobility: Supine to Sit     Supine to sit: Min assist     General bed mobility comments: Min A for RLE assist. Increased time required.   Transfers Overall transfer level: Needs assistance Equipment used: Rolling walker (2 wheeled) Transfers: Sit to/from Stand Sit to Stand: Min guard         General transfer comment: Min guard for safety. Increased time required. Verbal cues for safe hand placement.   Ambulation/Gait Ambulation/Gait assistance: Min guard Gait Distance (Feet): 125 Feet Assistive device: Rolling walker (2 wheeled) Gait Pattern/deviations: Step-to pattern;Step-through pattern;Decreased step length - right;Decreased step length - left;Decreased weight shift to right;Antalgic Gait velocity: Decreased    General Gait Details: Slow, antalgic gait. Verbal cues for sequencing using RW. Able to progress to step through pattern, however, did continue to demonstrate decreased weightshift to RLE.   Stairs             Wheelchair Mobility    Modified Rankin (Stroke Patients Only)       Balance Overall balance assessment: Needs assistance Sitting-balance support: No upper extremity supported;Feet supported Sitting balance-Leahy Scale: Good     Standing balance support: Bilateral upper extremity supported;During functional activity Standing balance-Leahy Scale: Poor Standing balance comment: Reliant on BUE support                              Pertinent Vitals/Pain Pain Assessment: Faces Faces Pain Scale: Hurts a little bit Pain Location: R hip  Pain Descriptors / Indicators: Aching;Operative site guarding Pain Intervention(s): Limited activity within patient's tolerance;Monitored during session;Repositioned    Home Living Family/patient expects to be discharged to:: Private residence Living Arrangements: Spouse/significant other Available Help at Discharge: Family;Available 24 hours/day Type of Home: House Home Access: Stairs to enter Entrance Stairs-Rails: Right Entrance Stairs-Number of Steps: 4 Home Layout: Two level Home Equipment: Walker - 2 wheels;Shower seat;Cane - single point      Prior Function Level of Independence: Independent               Hand Dominance        Extremity/Trunk Assessment   Upper Extremity Assessment Upper Extremity Assessment: Overall WFL for tasks assessed    Lower Extremity Assessment Lower Extremity Assessment: RLE deficits/detail;LLE deficits/detail RLE Deficits / Details: Reports some numbness in groin area. Deficits consistent with post op pain and weakness. Able to perform ther ex below.  LLE Deficits / Details: Reports some tingling in toes from previous surgery.     Cervical / Trunk Assessment  Cervical / Trunk Assessment: Normal  Communication   Communication: No difficulties  Cognition Arousal/Alertness: Awake/alert Behavior During Therapy: WFL for tasks assessed/performed Overall Cognitive Status: Within  Functional Limits for tasks assessed                                        General Comments General comments (skin integrity, edema, etc.): Pt's husband and parents present during session.     Exercises Total Joint Exercises Ankle Circles/Pumps: AROM;Both;20 reps Quad Sets: AROM;Right;10 reps   Assessment/Plan    PT Assessment Patient needs continued PT services  PT Problem List Decreased strength;Decreased balance;Decreased mobility;Impaired sensation;Pain       PT Treatment Interventions DME instruction;Stair training;Gait training;Functional mobility training;Therapeutic activities;Therapeutic exercise;Balance training;Patient/family education    PT Goals (Current goals can be found in the Care Plan section)  Acute Rehab PT Goals Patient Stated Goal: to go home tomorrow  PT Goal Formulation: With patient Time For Goal Achievement: 11/13/17 Potential to Achieve Goals: Good    Frequency 7X/week   Barriers to discharge        Co-evaluation               AM-PAC PT "6 Clicks" Daily Activity  Outcome Measure Difficulty turning over in bed (including adjusting bedclothes, sheets and blankets)?: A Little Difficulty moving from lying on back to sitting on the side of the bed? : Unable Difficulty sitting down on and standing up from a chair with arms (e.g., wheelchair, bedside commode, etc,.)?: Unable Help needed moving to and from a bed to chair (including a wheelchair)?: A Little Help needed walking in hospital room?: A Little Help needed climbing 3-5 steps with a railing? : A Little 6 Click Score: 14    End of Session Equipment Utilized During Treatment: Gait belt Activity Tolerance: Patient tolerated treatment well Patient left: in chair;with call bell/phone within reach;with family/visitor present Nurse Communication: Mobility status PT Visit Diagnosis: Other abnormalities of gait and mobility (R26.89);Pain Pain - Right/Left: Right Pain - part  of body: Hip    Time: 1436-1500 PT Time Calculation (min) (ACUTE ONLY): 24 min   Charges:   PT Evaluation $PT Eval Low Complexity: 1 Low PT Treatments $Gait Training: 8-22 mins        Leighton Ruff, PT, DPT  Acute Rehabilitation Services  Pager: [phone] Office: [phone]   [name] 10/30/2017, 3:05 PM

## 2017-10-30 NOTE — Anesthesia Procedure Notes (Signed)
Procedure Name: MAC Date/Time: 10/30/2017 7:40 AM Performed by: Jenne Campus, CRNA Pre-anesthesia Checklist: Patient identified, Emergency Drugs available, Suction available and Patient being monitored Oxygen Delivery Method: Simple face mask

## 2017-10-30 NOTE — Anesthesia Postprocedure Evaluation (Signed)
Anesthesia Post Note  Patient: Educational psychologist  Procedure(s) Performed: TOTAL HIP ARTHROPLASTY ANTERIOR APPROACH (Right Hip)     Patient location during evaluation: PACU Anesthesia Type: General Level of consciousness: awake Pain management: pain level controlled Vital Signs Assessment: post-procedure vital signs reviewed and stable Respiratory status: spontaneous breathing Cardiovascular status: stable Anesthetic complications: no    Last Vitals:  Vitals:   10/30/17 1100 10/30/17 1300  BP: 119/65 126/60  Pulse: 80 72  Resp: 20 20  Temp: (!) 36.1 C   SpO2: 100% 100%    Last Pain:  Vitals:   10/30/17 1100  TempSrc: Oral  PainSc: 0-No pain                 Latara Micheli

## 2017-10-30 NOTE — Transfer of Care (Signed)
Immediate Anesthesia Transfer of Care Note  Patient: Brandi Mitchell  Procedure(s) Performed: TOTAL HIP ARTHROPLASTY ANTERIOR APPROACH (Right Hip)  Patient Location: PACU  Anesthesia Type:MAC and Spinal  Level of Consciousness: awake, oriented and patient cooperative  Airway & Oxygen Therapy: Patient Spontanous Breathing and Patient connected to face mask oxygen  Post-op Assessment: Report given to RN and Post -op Vital signs reviewed and stable  Post vital signs: Reviewed  Last Vitals:  Vitals Value Taken Time  BP 119/60 10/30/2017  9:26 AM  Temp    Pulse 61 10/30/2017  9:27 AM  Resp 12 10/30/2017  9:27 AM  SpO2 100 % 10/30/2017  9:27 AM  Vitals shown include unvalidated device data.  Last Pain:  Vitals:   10/30/17 0624  TempSrc:   PainSc: 0-No pain      Patients Stated Pain Goal: 3 (21/82/88 3374)  Complications: No apparent anesthesia complications

## 2017-10-30 NOTE — Anesthesia Preprocedure Evaluation (Addendum)
Anesthesia Evaluation  Patient identified by MRN, date of birth, ID band Patient awake    Reviewed: Allergy & Precautions, NPO status , Patient's Chart, lab work & pertinent test results  Airway Mallampati: II  TM Distance: >3 FB Neck ROM: Full    Dental  (+) Teeth Intact, Dental Advisory Given   Pulmonary    breath sounds clear to auscultation       Cardiovascular negative cardio ROS   Rhythm:Regular Rate:Normal     Neuro/Psych    GI/Hepatic negative GI ROS, Neg liver ROS,   Endo/Other  Hypothyroidism   Renal/GU      Musculoskeletal  (+) Arthritis ,   Abdominal   Peds  Hematology   Anesthesia Other Findings   Reproductive/Obstetrics                            Anesthesia Physical Anesthesia Plan  ASA: III  Anesthesia Plan: General   Post-op Pain Management:    Induction: Intravenous  PONV Risk Score and Plan: Ondansetron, Dexamethasone and Midazolam  Airway Management Planned:   Additional Equipment:   Intra-op Plan:   Post-operative Plan:   Informed Consent: I have reviewed the patients History and Physical, chart, labs and discussed the procedure including the risks, benefits and alternatives for the proposed anesthesia with the patient or authorized representative who has indicated his/her understanding and acceptance.   Dental advisory given  Plan Discussed with: CRNA and Anesthesiologist  Anesthesia Plan Comments:         Anesthesia Quick Evaluation

## 2017-10-31 ENCOUNTER — Encounter (HOSPITAL_COMMUNITY): Payer: Self-pay | Admitting: Orthopaedic Surgery

## 2017-10-31 MED ORDER — PROMETHAZINE HCL 12.5 MG PO TABS: 12.5000 mg | ORAL_TABLET | Freq: Four times a day (QID) | ORAL | 0 refills | Status: DC | PRN

## 2017-10-31 MED ORDER — HYDROCODONE-ACETAMINOPHEN 5-325 MG PO TABS: 1.0000 | ORAL_TABLET | ORAL | 0 refills | Status: DC | PRN

## 2017-10-31 MED ORDER — TIZANIDINE HCL 4 MG PO TABS: 4.0000 mg | ORAL_TABLET | Freq: Three times a day (TID) | ORAL | 1 refills | Status: AC

## 2017-10-31 MED ORDER — APIXABAN 2.5 MG PO TABS: 2.5000 mg | ORAL_TABLET | Freq: Two times a day (BID) | ORAL | 0 refills | Status: DC

## 2017-10-31 NOTE — Care Management Note (Signed)
Case Management Note  Patient Details  Name: Brandi Mitchell MRN: [id number] Date of Birth: 03/12/1960  Subjective/Objective:   Right THA             Action/Plan: NCM spoke to pt and offered choice for [facility]. Pt had [facility] in the past for [facility]. She has RW and 3n1 bedside commode at home. Husband at home to assist with care.   Expected Discharge Date:  10/31/17               Expected Discharge Plan:  [place]  In-House Referral:  NA  Discharge planning Services  CM Consult  Post Acute Care Choice:  Home Health Choice offered to:  Patient  DME Arranged:  N/A DME Agency:  NA  HH Arranged:  PT [place] Agency:  [place]  Status of Service:  Completed, signed off  If discussed at [place] of Stay Meetings, dates discussed:    Additional Comments:  Erenest Rasher, RN 10/31/2017, 10:23 AM

## 2017-10-31 NOTE — Progress Notes (Signed)
Physical Therapy Treatment Patient Details Name: Brandi Mitchell MRN: [id number] DOB: 1960-06-17 Today's Date: 10/31/2017    History of Present Illness Pt is a 57 y/o female s/p elective R THA, direct anterior. PMH includes L THA, and migranes.     PT Comments    Pt making steady progress with functional mobility and successfully completed stair training this session. PT will continue to follow acutely.    Follow Up Recommendations  Outpatient PT     Equipment Recommendations  None recommended by PT    Recommendations for Other Services       Precautions / Restrictions Precautions Precautions: None Restrictions Weight Bearing Restrictions: Yes RLE Weight Bearing: Weight bearing as tolerated    Mobility  Bed Mobility Overal bed mobility: Modified Independent             General bed mobility comments: increased time  Transfers Overall transfer level: Needs assistance Equipment used: Rolling walker (2 wheeled) Transfers: Sit to/from Stand Sit to Stand: Supervision         General transfer comment: supervision for safety, good technique  Ambulation/Gait Ambulation/Gait assistance: Min guard Gait Distance (Feet): 400 Feet Assistive device: Rolling walker (2 wheeled) Gait Pattern/deviations: Step-through pattern;Decreased step length - right;Decreased step length - left;Decreased weight shift to right Gait velocity: Decreased  Gait velocity interpretation: 1.31 - 2.62 ft/sec, indicative of limited community ambulator General Gait Details: pt steady with RW, min guard for safety, no instability or LOB   Stairs Stairs: Yes Stairs assistance: Min guard Stair Management: Two rails;Step to pattern;Forwards Number of Stairs: 2 General stair comments: min guard for safety, cueing for technique and sequencing   Wheelchair Mobility    Modified Rankin (Stroke Patients Only)       Balance Overall balance assessment: Needs assistance Sitting-balance support: No  upper extremity supported;Feet supported Sitting balance-Leahy Scale: Good     Standing balance support: Bilateral upper extremity supported;During functional activity Standing balance-Leahy Scale: Poor                              Cognition Arousal/Alertness: Awake/alert Behavior During Therapy: WFL for tasks assessed/performed Overall Cognitive Status: Within Functional Limits for tasks assessed                                        Exercises      General Comments        Pertinent Vitals/Pain Pain Assessment: Faces Faces Pain Scale: Hurts a little bit Pain Location: R hip  Pain Descriptors / Indicators: Aching;Operative site guarding Pain Intervention(s): Monitored during session;Repositioned    Home Living                      Prior Function            PT Goals (current goals can now be found in the care plan section) Acute Rehab PT Goals PT Goal Formulation: With patient Time For Goal Achievement: 11/13/17 Potential to Achieve Goals: Good Progress towards PT goals: Progressing toward goals    Frequency    7X/week      PT Plan Current plan remains appropriate    Co-evaluation              AM-PAC PT "6 Clicks" Daily Activity  Outcome Measure  Difficulty turning over in bed (including adjusting bedclothes, sheets and  blankets)?: None Difficulty moving from lying on back to sitting on the side of the bed? : None Difficulty sitting down on and standing up from a chair with arms (e.g., wheelchair, bedside commode, etc,.)?: Unable Help needed moving to and from a bed to chair (including a wheelchair)?: None Help needed walking in hospital room?: None Help needed climbing 3-5 steps with a railing? : A Little 6 Click Score: 20    End of Session Equipment Utilized During Treatment: Gait belt Activity Tolerance: Patient tolerated treatment well Patient left: in chair;with call bell/phone within reach Nurse  Communication: Mobility status PT Visit Diagnosis: Other abnormalities of gait and mobility (R26.89);Pain Pain - Right/Left: Right Pain - part of body: Hip     Time: 0821-0836 PT Time Calculation (min) (ACUTE ONLY): 15 min  Charges:  $Gait Training: 8-22 mins                     Sherie Don, [place], DPT  Acute Rehabilitation Services Pager [phone] Office [place] 10/31/2017, 11:56 AM

## 2017-10-31 NOTE — Progress Notes (Signed)
Pt was ambulating to the hall with a walker. Denies pain. Right hip incision with Aquacel dressing dry and intact. Discharge instructions given to pt, verbalized understanding. Discharged to home accompanied by spouse.

## 2017-10-31 NOTE — Progress Notes (Signed)
Subjective: 1 Day Post-Op Procedure(s) (LRB): TOTAL HIP ARTHROPLASTY ANTERIOR APPROACH (Right) Patient feels great and wants to go home today. Activity level:  wbat Diet tolerance:  ok Voiding:  ok Patient reports pain as mild.    Objective: Vital signs in last 24 hours: Temp:  [97 F (36.1 C)-98.9 F (37.2 C)] 97.9 F (36.6 C) (09/11 0331) Pulse Rate:  [46-80] 70 (09/11 0331) Resp:  [10-27] 15 (09/11 0331) BP: (111-148)/(55-89) 111/55 (09/11 0331) SpO2:  [97 %-100 %] 100 % (09/11 0331)  Labs: No results for input(s): HGB in the last 72 hours. No results for input(s): WBC, RBC, HCT, PLT in the last 72 hours. No results for input(s): NA, K, CL, CO2, BUN, CREATININE, GLUCOSE, CALCIUM in the last 72 hours. No results for input(s): LABPT, INR in the last 72 hours.  Physical Exam:  Neurologically intact ABD soft Neurovascular intact Sensation intact distally Intact pulses distally Dorsiflexion/Plantar flexion intact Incision: dressing C/D/I and no drainage No cellulitis present Compartment soft  Assessment/Plan:  1 Day Post-Op Procedure(s) (LRB): TOTAL HIP ARTHROPLASTY ANTERIOR APPROACH (Right) Advance diet Up with therapy Discharge home with home health today. Continue on Eliquis for DVT prevention. Follow up in office 2 weeks post op  Brandi Mitchell, Larwance Sachs 10/31/2017, 7:57 AM

## 2017-10-31 NOTE — Discharge Summary (Signed)
Patient ID: Brandi Mitchell MRN: [id number] DOB/AGE: [birth year] 57 y.o.  Admit date: 10/30/2017 Discharge date: 10/31/2017  Admission Diagnoses:  Principal Problem:   Primary osteoarthritis of right hip   Discharge Diagnoses:  Same  Past Medical History:  Diagnosis Date  . Degenerative joint disease (DJD) of hip    left hip  . Heart murmur    "I have had it all my life but no issues with it"  . Hypothyroidism   . Migraine headache   . Thyroid disease     Surgeries: Procedure(s): TOTAL HIP ARTHROPLASTY ANTERIOR APPROACH on 10/30/2017   Consultants:   Discharged Condition: Improved  Hospital Course: Lummie Montijo is an 57 y.o. female who was admitted 10/30/2017 for operative treatment ofPrimary osteoarthritis of right hip. Patient has severe unremitting pain that affects sleep, daily activities, and work/hobbies. After pre-op clearance the patient was taken to the operating room on 10/30/2017 and underwent  Procedure(s): TOTAL HIP ARTHROPLASTY ANTERIOR APPROACH.    Patient was given perioperative antibiotics:  Anti-infectives (From admission, onward)   Start     Dose/Rate Route Frequency Ordered Stop   10/30/17 1800  vancomycin (VANCOCIN) IVPB 1000 mg/200 mL premix     1,000 mg 200 mL/hr over 60 Minutes Intravenous Every 12 hours 10/30/17 1119 10/30/17 1901   10/30/17 0600  vancomycin (VANCOCIN) IVPB 1000 mg/200 mL premix     1,000 mg 200 mL/hr over 60 Minutes Intravenous On call to O.R. 10/30/17 [id number] 10/30/17 0717       Patient was given sequential compression devices, early ambulation, and chemoprophylaxis to prevent DVT.  Patient benefited maximally from hospital stay and there were no complications.    Recent vital signs:  Patient Vitals for the past 24 hrs:  BP Temp Temp src Pulse Resp SpO2  10/31/17 0331 (!) 111/55 97.9 F (36.6 C) Oral 70 15 100 %  10/31/17 0100 (!) 120/59 - - 61 15 97 %  10/30/17 1921 (!) 116/59 98.9 F (37.2 C) Oral 66 14 100 %  10/30/17 1300  126/60 - - 72 20 100 %  10/30/17 1100 119/65 (!) 97 F (36.1 C) Oral 80 20 100 %  10/30/17 1045 123/89 (!) 97 F (36.1 C) - 71 (!) 27 100 %  10/30/17 1030 116/69 - - (!) 50 10 100 %  10/30/17 1015 (!) 148/71 - - (!) 59 16 100 %  10/30/17 1000 129/71 - - (!) 48 14 100 %  10/30/17 0945 134/86 - - (!) 46 18 100 %  10/30/17 0930 - - - 61 14 100 %  10/30/17 0927 119/60 (!) 97.3 F (36.3 C) - (!) 59 16 100 %     Recent laboratory studies: No results for input(s): WBC, HGB, HCT, PLT, NA, K, CL, CO2, BUN, CREATININE, GLUCOSE, INR, CALCIUM in the last 72 hours.  Invalid input(s): PT, 2   Discharge Medications:   Allergies as of 10/31/2017      Reactions   Indomethacin Nausea Only   Keflex [cephalexin] Nausea Only   Percocet [oxycodone-acetaminophen] Nausea Only   Tramadol Nausea Only      Medication List    STOP taking these medications   aspirin 325 MG EC tablet     TAKE these medications   apixaban 2.5 MG Tabs tablet Commonly known as:  ELIQUIS Take 1 tablet (2.5 mg total) by mouth 2 (two) times daily.   bisacodyl 5 MG EC tablet Commonly known as:  DULCOLAX Take 1 tablet (5 mg total) by mouth daily  as needed for moderate constipation.   CALCIUM 600 + D PO Take 1 tablet by mouth daily.   docusate sodium 100 MG capsule Commonly known as:  COLACE Take 1 capsule (100 mg total) by mouth 2 (two) times daily.   HYDROcodone-acetaminophen 5-325 MG tablet Commonly known as:  NORCO/VICODIN Take 1-2 tablets by mouth every 4 (four) hours as needed for severe pain ((score 7 to 10)).   levothyroxine 75 MCG tablet Commonly known as:  SYNTHROID, LEVOTHROID Take 75 mcg by mouth daily before breakfast.   promethazine 12.5 MG tablet Commonly known as:  PHENERGAN Take 1-2 tablets (12.5-25 mg total) by mouth every 6 (six) hours as needed for nausea or vomiting.   tiZANidine 4 MG tablet Commonly known as:  ZANAFLEX Take 1 tablet (4 mg total) by mouth 3 (three) times daily.    vitamin B-12 1000 MCG tablet Commonly known as:  CYANOCOBALAMIN Take 1,000 mcg by mouth daily.            Durable Medical Equipment  (From admission, onward)         Start     Ordered   10/30/17 1120  DME Walker rolling  Once    Question:  Patient needs a walker to treat with the following condition  Answer:  Primary osteoarthritis of right hip   10/30/17 1119   10/30/17 1120  DME 3 n 1  Once     10/30/17 1119   10/30/17 1120  DME Bedside commode  Once    Question:  Patient needs a bedside commode to treat with the following condition  Answer:  Primary osteoarthritis of right hip   10/30/17 1119          Diagnostic Studies: Dg C-arm 1-60 Min  Result Date: 10/30/2017 CLINICAL DATA:  Anterior approach right total hip arthroplasty EXAM: DG HIP (WITH OR WITHOUT PELVIS) 2-3V RIGHT; DG C-ARM 61-120 MIN COMPARISON:  None FINDINGS: The patient has undergone placement of a right total hip prosthesis. Radiographic positioning of the prosthetic components is good. The interface with the native bone appears normal. IMPRESSION: No immediate complication following right total hip arthroplasty. Electronically Signed   By: David  Martinique M.D.   On: 10/30/2017 09:54   Dg Hip Unilat With Pelvis 2-3 Views Right  Result Date: 10/30/2017 CLINICAL DATA:  Anterior approach right total hip arthroplasty EXAM: DG HIP (WITH OR WITHOUT PELVIS) 2-3V RIGHT; DG C-ARM 61-120 MIN COMPARISON:  None FINDINGS: The patient has undergone placement of a right total hip prosthesis. Radiographic positioning of the prosthetic components is good. The interface with the native bone appears normal. IMPRESSION: No immediate complication following right total hip arthroplasty. Electronically Signed   By: David  Martinique M.D.   On: 10/30/2017 09:54    Disposition: Discharge disposition: 01-Home or Self Care       Discharge Instructions    Call MD / Call 911   Complete by:  As directed    If you experience chest pain  or shortness of breath, CALL 911 and be transported to the hospital emergency room.  If you develope a fever above 101 F, pus (white drainage) or increased drainage or redness at the wound, or calf pain, call your surgeon's office.   Constipation Prevention   Complete by:  As directed    Drink plenty of fluids.  Prune juice may be helpful.  You may use a stool softener, such as Colace (over the counter) 100 mg twice a day.  Use MiraLax (over  the counter) for constipation as needed.   Diet - low sodium heart healthy   Complete by:  As directed    Discharge instructions   Complete by:  As directed    INSTRUCTIONS AFTER JOINT REPLACEMENT   Remove items at home which could result in a fall. This includes throw rugs or furniture in walking pathways ICE to the affected joint every three hours while awake for 30 minutes at a time, for at least the first 3-5 days, and then as needed for pain and swelling.  Continue to use ice for pain and swelling. You may notice swelling that will progress down to the foot and ankle.  This is normal after surgery.  Elevate your leg when you are not up walking on it.   Continue to use the breathing machine you got in the hospital (incentive spirometer) which will help keep your temperature down.  It is common for your temperature to cycle up and down following surgery, especially at night when you are not up moving around and exerting yourself.  The breathing machine keeps your lungs expanded and your temperature down.   DIET:  As you were doing prior to hospitalization, we recommend a well-balanced diet.  DRESSING / WOUND CARE / SHOWERING  You may shower 3 days after surgery, but keep the wounds dry during showering.  You may use an occlusive plastic wrap (Press'n Seal for example), NO SOAKING/SUBMERGING IN THE BATHTUB.  If the bandage gets wet, change with a clean dry gauze.  If the incision gets wet, pat the wound dry with a clean towel.  ACTIVITY  Increase  activity slowly as tolerated, but follow the weight bearing instructions below.   No driving for 6 weeks or until further direction given by your physician.  You cannot drive while taking narcotics.  No lifting or carrying greater than 10 lbs. until further directed by your surgeon. Avoid periods of inactivity such as sitting longer than an hour when not asleep. This helps prevent blood clots.  You may return to work once you are authorized by your doctor.     WEIGHT BEARING   Weight bearing as tolerated with assist device (walker, cane, etc) as directed, use it as long as suggested by your surgeon or therapist, typically at least 4-6 weeks.   EXERCISES  Results after joint replacement surgery are often greatly improved when you follow the exercise, range of motion and muscle strengthening exercises prescribed by your doctor. Safety measures are also important to protect the joint from further injury. Any time any of these exercises cause you to have increased pain or swelling, decrease what you are doing until you are comfortable again and then slowly increase them. If you have problems or questions, call your caregiver or physical therapist for advice.   Rehabilitation is important following a joint replacement. After just a few days of immobilization, the muscles of the leg can become weakened and shrink (atrophy).  These exercises are designed to build up the tone and strength of the thigh and leg muscles and to improve motion. Often times heat used for twenty to thirty minutes before working out will loosen up your tissues and help with improving the range of motion but do not use heat for the first two weeks following surgery (sometimes heat can increase post-operative swelling).   These exercises can be done on a training (exercise) mat, on the floor, on a table or on a bed. Use whatever works the best and is  most comfortable for you.    Use music or television while you are exercising so  that the exercises are a pleasant break in your day. This will make your life better with the exercises acting as a break in your routine that you can look forward to.   Perform all exercises about fifteen times, three times per day or as directed.  You should exercise both the operative leg and the other leg as well.   Exercises include:   Quad Sets - Tighten up the muscle on the front of the thigh (Quad) and hold for 5-10 seconds.   Straight Leg Raises - With your knee straight (if you were given a brace, keep it on), lift the leg to 60 degrees, hold for 3 seconds, and slowly lower the leg.  Perform this exercise against resistance later as your leg gets stronger.  Leg Slides: Lying on your back, slowly slide your foot toward your buttocks, bending your knee up off the floor (only go as far as is comfortable). Then slowly slide your foot back down until your leg is flat on the floor again.  Angel Wings: Lying on your back spread your legs to the side as far apart as you can without causing discomfort.  Hamstring Strength:  Lying on your back, push your heel against the floor with your leg straight by tightening up the muscles of your buttocks.  Repeat, but this time bend your knee to a comfortable angle, and push your heel against the floor.  You may put a pillow under the heel to make it more comfortable if necessary.   A rehabilitation program following joint replacement surgery can speed recovery and prevent re-injury in the future due to weakened muscles. Contact your doctor or a physical therapist for more information on knee rehabilitation.    CONSTIPATION  Constipation is defined medically as fewer than three stools per week and severe constipation as less than one stool per week.  Even if you have a regular bowel pattern at home, your normal regimen is likely to be disrupted due to multiple reasons following surgery.  Combination of anesthesia, postoperative narcotics, change in appetite and  fluid intake all can affect your bowels.   YOU MUST use at least one of the following options; they are listed in order of increasing strength to get the job done.  They are all available over the counter, and you may need to use some, POSSIBLY even all of these options:    Drink plenty of fluids (prune juice may be helpful) and high fiber foods Colace 100 mg by mouth twice a day  Senokot for constipation as directed and as needed Dulcolax (bisacodyl), take with full glass of water  Miralax (polyethylene glycol) once or twice a day as needed.  If you have tried all these things and are unable to have a bowel movement in the first 3-4 days after surgery call either your surgeon or your primary doctor.    If you experience loose stools or diarrhea, hold the medications until you stool forms back up.  If your symptoms do not get better within 1 week or if they get worse, check with your doctor.  If you experience "the worst abdominal pain ever" or develop nausea or vomiting, please contact the office immediately for further recommendations for treatment.   ITCHING:  If you experience itching with your medications, try taking only a single pain pill, or even half a pain pill at a time.  You can also use Benadryl over the counter for itching or also to help with sleep.   TED HOSE STOCKINGS:  Use stockings on both legs until for at least 2 weeks or as directed by physician office. They may be removed at night for sleeping.  MEDICATIONS:  See your medication summary on the "After Visit Summary" that nursing will review with you.  You may have some home medications which will be placed on hold until you complete the course of blood thinner medication.  It is important for you to complete the blood thinner medication as prescribed.  PRECAUTIONS:  If you experience chest pain or shortness of breath - call 911 immediately for transfer to the hospital emergency department.   If you develop a fever greater  that 101 F, purulent drainage from wound, increased redness or drainage from wound, foul odor from the wound/dressing, or calf pain - CONTACT YOUR SURGEON.                                                   FOLLOW-UP APPOINTMENTS:  If you do not already have a post-op appointment, please call the office for an appointment to be seen by your surgeon.  Guidelines for how soon to be seen are listed in your "After Visit Summary", but are typically between 1-4 weeks after surgery.  OTHER INSTRUCTIONS:   Knee Replacement:  Do not place pillow under knee, focus on keeping the knee straight while resting. CPM instructions: 0-90 degrees, 2 hours in the morning, 2 hours in the afternoon, and 2 hours in the evening. Place foam block, curve side up under heel at all times except when in CPM or when walking.  DO NOT modify, tear, cut, or change the foam block in any way.  MAKE SURE YOU:  Understand these instructions.  Get help right away if you are not doing well or get worse.    Thank you for letting us be a part of your medical care team.  It is a privilege we respect greatly.  We hope these instructions will help you stay on track for a fast and full recovery!   Increase activity slowly as tolerated   Complete by:  As directed       Follow-up Information    Melrose Nakayama, MD. Schedule an appointment as soon as possible for a visit in 2 weeks.   Specialty:  Orthopedic Surgery Contact information: [place] [address] [phone]            Signed: Rich Fuchs 10/31/2017, 8:02 AM

## 2017-10-31 NOTE — Discharge Instructions (Signed)
Information on my medicine - ELIQUIS (apixaban)  This medication education was reviewed with me or my healthcare representative as part of my discharge preparation.  The pharmacist that spoke with me during my hospital stay was:  Sindy Guadeloupe, [facility]  Why was Eliquis prescribed for you? Eliquis was prescribed for you to reduce the risk of blood clots forming after orthopedic surgery.    What do You need to know about Eliquis? Take your Eliquis TWICE DAILY - one tablet in the morning and one tablet in the evening with or without food.  It would be best to take the dose about the same time each day.  If you have difficulty swallowing the tablet whole please discuss with your pharmacist how to take the medication safely.  Take Eliquis exactly as prescribed by your doctor and DO NOT stop taking Eliquis without talking to the doctor who prescribed the medication.  Stopping without other medication to take the place of Eliquis may increase your risk of developing a clot.  After discharge, you should have regular check-up appointments with your healthcare provider that is prescribing your Eliquis.  What do you do if you miss a dose? If a dose of ELIQUIS is not taken at the scheduled time, take it as soon as possible on the same day and twice-daily administration should be resumed.  The dose should not be doubled to make up for a missed dose.  Do not take more than one tablet of ELIQUIS at the same time.  Important Safety Information A possible side effect of Eliquis is bleeding. You should call your healthcare provider right away if you experience any of the following: ? Bleeding from an injury or your nose that does not stop. ? Unusual colored urine (red or dark brown) or unusual colored stools (red or black). ? Unusual bruising for unknown reasons. ? A serious fall or if you hit your head (even if there is no bleeding).  Some medicines may interact with Eliquis and might increase  your risk of bleeding or clotting while on Eliquis. To help avoid this, consult your healthcare provider or pharmacist prior to using any new prescription or non-prescription medications, including herbals, vitamins, non-steroidal anti-inflammatory drugs (NSAIDs) and supplements.  This website has more information on Eliquis (apixaban): http://www.eliquis.com/eliquis/home

## 2017-11-01 DIAGNOSIS — Z471 Aftercare following joint replacement surgery: Secondary | ICD-10-CM | POA: Diagnosis not present

## 2017-11-01 DIAGNOSIS — E039 Hypothyroidism, unspecified: Secondary | ICD-10-CM | POA: Diagnosis not present

## 2017-11-01 DIAGNOSIS — Z96643 Presence of artificial hip joint, bilateral: Secondary | ICD-10-CM | POA: Diagnosis not present

## 2017-11-01 DIAGNOSIS — G43909 Migraine, unspecified, not intractable, without status migrainosus: Secondary | ICD-10-CM | POA: Diagnosis not present

## 2017-11-07 DIAGNOSIS — Z471 Aftercare following joint replacement surgery: Secondary | ICD-10-CM | POA: Diagnosis not present

## 2017-11-07 DIAGNOSIS — E039 Hypothyroidism, unspecified: Secondary | ICD-10-CM | POA: Diagnosis not present

## 2017-11-07 DIAGNOSIS — G43909 Migraine, unspecified, not intractable, without status migrainosus: Secondary | ICD-10-CM | POA: Diagnosis not present

## 2017-11-08 DIAGNOSIS — Z96641 Presence of right artificial hip joint: Secondary | ICD-10-CM | POA: Diagnosis not present

## 2017-11-08 DIAGNOSIS — M1611 Unilateral primary osteoarthritis, right hip: Secondary | ICD-10-CM | POA: Diagnosis not present

## 2017-11-13 DIAGNOSIS — Z96641 Presence of right artificial hip joint: Secondary | ICD-10-CM | POA: Diagnosis not present

## 2017-11-15 DIAGNOSIS — Z96641 Presence of right artificial hip joint: Secondary | ICD-10-CM | POA: Diagnosis not present

## 2017-11-19 DIAGNOSIS — Z96641 Presence of right artificial hip joint: Secondary | ICD-10-CM | POA: Diagnosis not present

## 2017-11-21 ENCOUNTER — Other Ambulatory Visit: Payer: Self-pay | Admitting: Internal Medicine

## 2017-11-21 DIAGNOSIS — Z1231 Encounter for screening mammogram for malignant neoplasm of breast: Secondary | ICD-10-CM

## 2017-11-23 DIAGNOSIS — Z96641 Presence of right artificial hip joint: Secondary | ICD-10-CM | POA: Diagnosis not present

## 2017-11-27 DIAGNOSIS — Z96641 Presence of right artificial hip joint: Secondary | ICD-10-CM | POA: Diagnosis not present

## 2017-11-29 DIAGNOSIS — Z96641 Presence of right artificial hip joint: Secondary | ICD-10-CM | POA: Diagnosis not present

## 2017-12-04 DIAGNOSIS — Z96641 Presence of right artificial hip joint: Secondary | ICD-10-CM | POA: Diagnosis not present

## 2017-12-06 DIAGNOSIS — Z96641 Presence of right artificial hip joint: Secondary | ICD-10-CM | POA: Diagnosis not present

## 2017-12-11 DIAGNOSIS — Z96641 Presence of right artificial hip joint: Secondary | ICD-10-CM | POA: Diagnosis not present

## 2017-12-13 DIAGNOSIS — M810 Age-related osteoporosis without current pathological fracture: Secondary | ICD-10-CM | POA: Diagnosis not present

## 2017-12-13 DIAGNOSIS — Z96641 Presence of right artificial hip joint: Secondary | ICD-10-CM | POA: Diagnosis not present

## 2017-12-18 DIAGNOSIS — Z96641 Presence of right artificial hip joint: Secondary | ICD-10-CM | POA: Diagnosis not present

## 2017-12-20 DIAGNOSIS — Z96641 Presence of right artificial hip joint: Secondary | ICD-10-CM | POA: Diagnosis not present

## 2017-12-21 DIAGNOSIS — M79672 Pain in left foot: Secondary | ICD-10-CM | POA: Diagnosis not present

## 2017-12-25 DIAGNOSIS — Z96641 Presence of right artificial hip joint: Secondary | ICD-10-CM | POA: Diagnosis not present

## 2017-12-27 DIAGNOSIS — Z96641 Presence of right artificial hip joint: Secondary | ICD-10-CM | POA: Diagnosis not present

## 2018-01-01 DIAGNOSIS — Z96641 Presence of right artificial hip joint: Secondary | ICD-10-CM | POA: Diagnosis not present

## 2018-01-02 DIAGNOSIS — M79662 Pain in left lower leg: Secondary | ICD-10-CM | POA: Diagnosis not present

## 2018-01-03 DIAGNOSIS — Z96641 Presence of right artificial hip joint: Secondary | ICD-10-CM | POA: Diagnosis not present

## 2018-01-07 ENCOUNTER — Ambulatory Visit
Admission: RE | Admit: 2018-01-07 | Discharge: 2018-01-07 | Disposition: A | Discharge status: Home / Self Care | Payer: 59 | Source: Ambulatory Visit | Attending: Internal Medicine | Admitting: Internal Medicine

## 2018-01-07 DIAGNOSIS — Z1231 Encounter for screening mammogram for malignant neoplasm of breast: Secondary | ICD-10-CM

## 2018-01-08 DIAGNOSIS — S86112D Strain of other muscle(s) and tendon(s) of posterior muscle group at lower leg level, left leg, subsequent encounter: Secondary | ICD-10-CM | POA: Diagnosis not present

## 2018-01-10 DIAGNOSIS — S86112D Strain of other muscle(s) and tendon(s) of posterior muscle group at lower leg level, left leg, subsequent encounter: Secondary | ICD-10-CM | POA: Diagnosis not present

## 2018-01-15 DIAGNOSIS — S86112D Strain of other muscle(s) and tendon(s) of posterior muscle group at lower leg level, left leg, subsequent encounter: Secondary | ICD-10-CM | POA: Diagnosis not present

## 2018-01-22 DIAGNOSIS — S86112D Strain of other muscle(s) and tendon(s) of posterior muscle group at lower leg level, left leg, subsequent encounter: Secondary | ICD-10-CM | POA: Diagnosis not present

## 2018-01-24 DIAGNOSIS — S86112D Strain of other muscle(s) and tendon(s) of posterior muscle group at lower leg level, left leg, subsequent encounter: Secondary | ICD-10-CM | POA: Diagnosis not present

## 2018-01-29 DIAGNOSIS — S86112D Strain of other muscle(s) and tendon(s) of posterior muscle group at lower leg level, left leg, subsequent encounter: Secondary | ICD-10-CM | POA: Diagnosis not present

## 2018-01-30 DIAGNOSIS — M79662 Pain in left lower leg: Secondary | ICD-10-CM | POA: Diagnosis not present

## 2018-01-30 DIAGNOSIS — M1611 Unilateral primary osteoarthritis, right hip: Secondary | ICD-10-CM | POA: Diagnosis not present

## 2018-01-31 DIAGNOSIS — S86112D Strain of other muscle(s) and tendon(s) of posterior muscle group at lower leg level, left leg, subsequent encounter: Secondary | ICD-10-CM | POA: Diagnosis not present

## 2018-02-05 DIAGNOSIS — S86112D Strain of other muscle(s) and tendon(s) of posterior muscle group at lower leg level, left leg, subsequent encounter: Secondary | ICD-10-CM | POA: Diagnosis not present

## 2019-01-01 ENCOUNTER — Other Ambulatory Visit: Payer: Self-pay | Admitting: Internal Medicine

## 2019-01-01 DIAGNOSIS — Z1231 Encounter for screening mammogram for malignant neoplasm of breast: Secondary | ICD-10-CM

## 2019-01-09 ENCOUNTER — Other Ambulatory Visit: Payer: Self-pay

## 2019-01-09 ENCOUNTER — Ambulatory Visit
Admission: RE | Admit: 2019-01-09 | Discharge: 2019-01-09 | Disposition: A | Discharge status: Home / Self Care | Payer: Commercial Managed Care - PPO | Source: Ambulatory Visit | Attending: Internal Medicine | Admitting: Internal Medicine

## 2019-01-09 DIAGNOSIS — Z1231 Encounter for screening mammogram for malignant neoplasm of breast: Secondary | ICD-10-CM

## 2019-02-25 ENCOUNTER — Ambulatory Visit: Payer: 59

## 2019-02-25 ENCOUNTER — Inpatient Hospital Stay
Admission: RE | Admit: 2019-02-25 | Discharge: 2019-02-25 | Disposition: A | Discharge status: Home / Self Care | Payer: 59 | Source: Ambulatory Visit | Attending: Internal Medicine | Admitting: Internal Medicine

## 2019-02-25 DIAGNOSIS — Z1231 Encounter for screening mammogram for malignant neoplasm of breast: Secondary | ICD-10-CM

## 2019-08-20 IMAGING — CR DG HIP (WITH OR WITHOUT PELVIS) 2-3V*L*
3 series · 3 of 3 positions shown · non-contrast
Comparison: 10/15/2015

CLINICAL DATA: Pain in the left hip and thigh

EXAM:
DG HIP (WITH OR WITHOUT PELVIS) 2-3V LEFT

[pelvis ap]
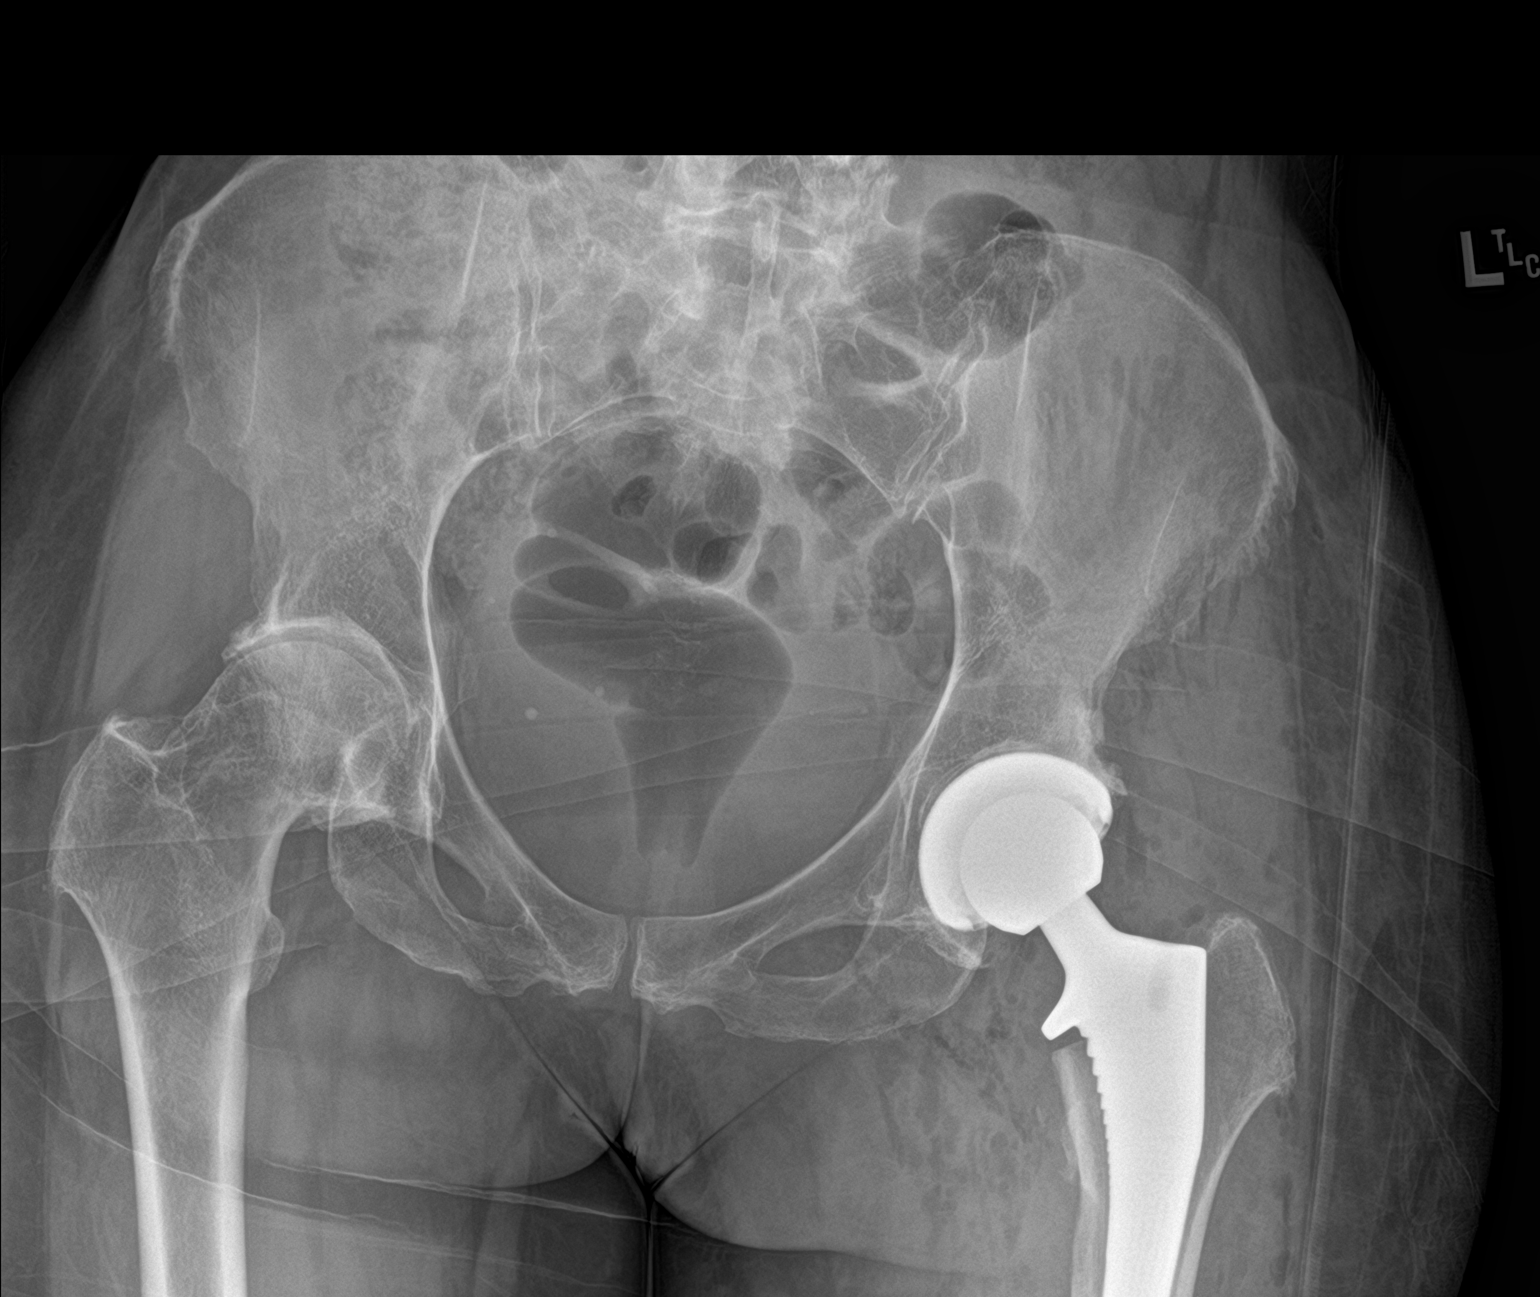

[hip ap]
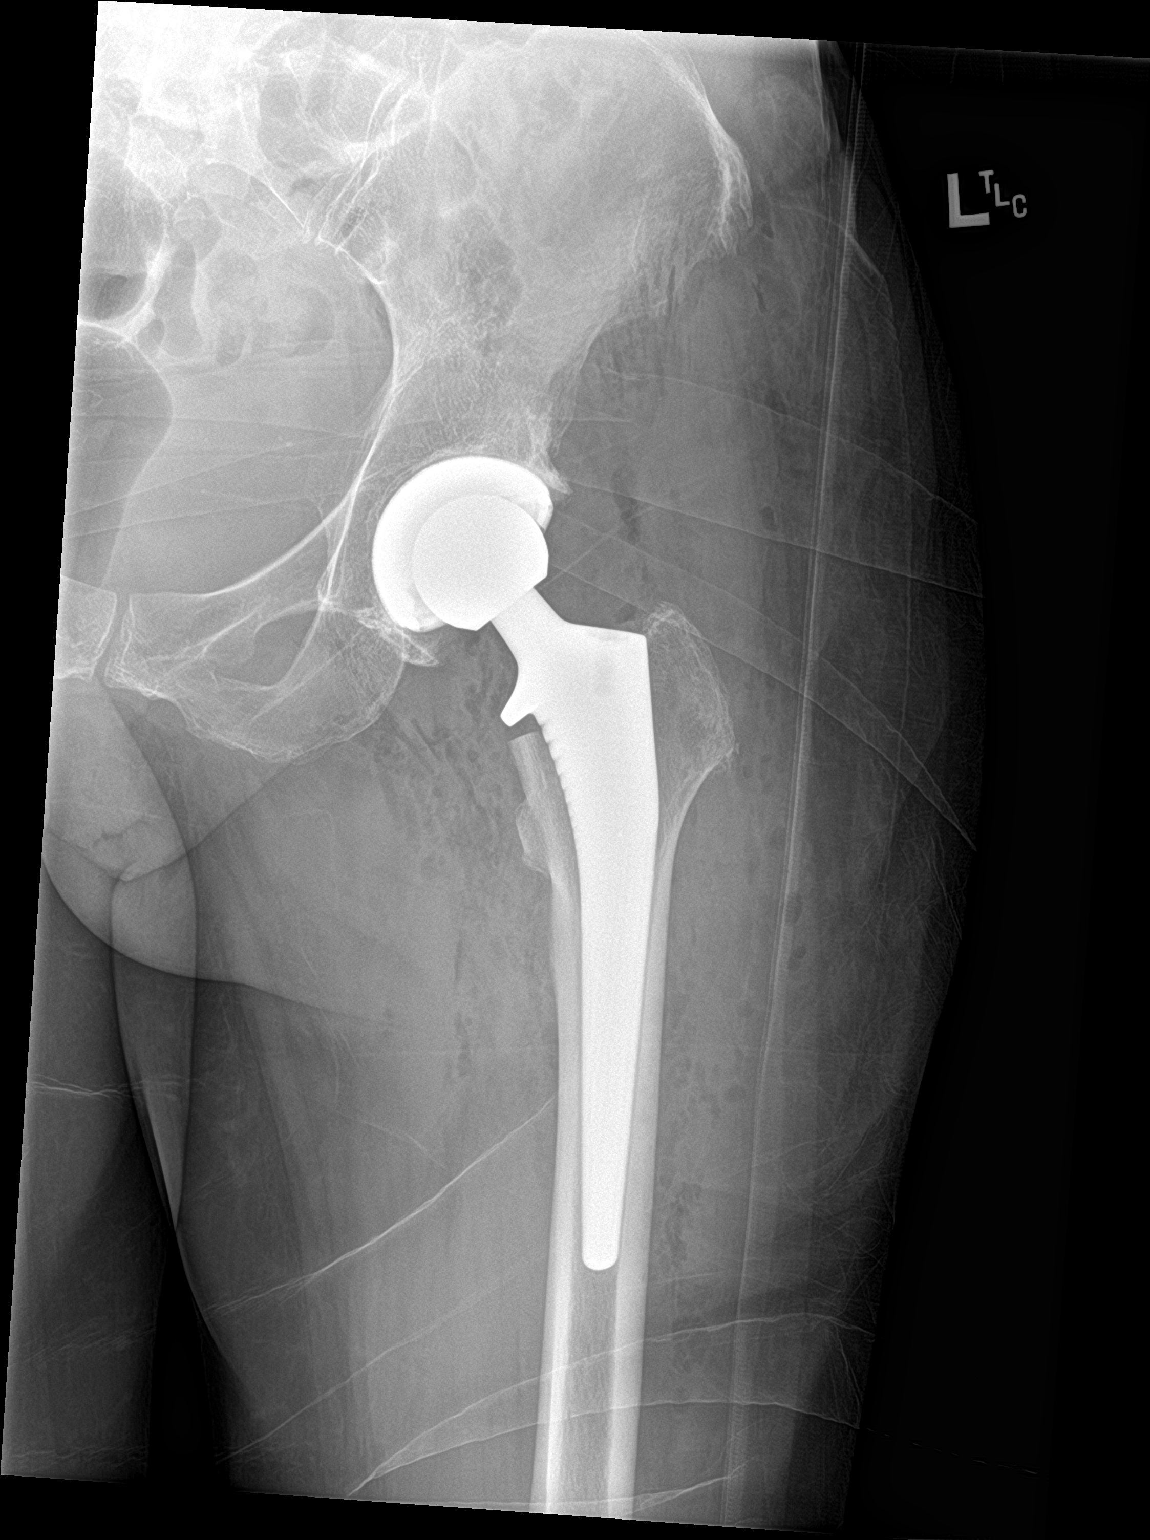

[hip lat]
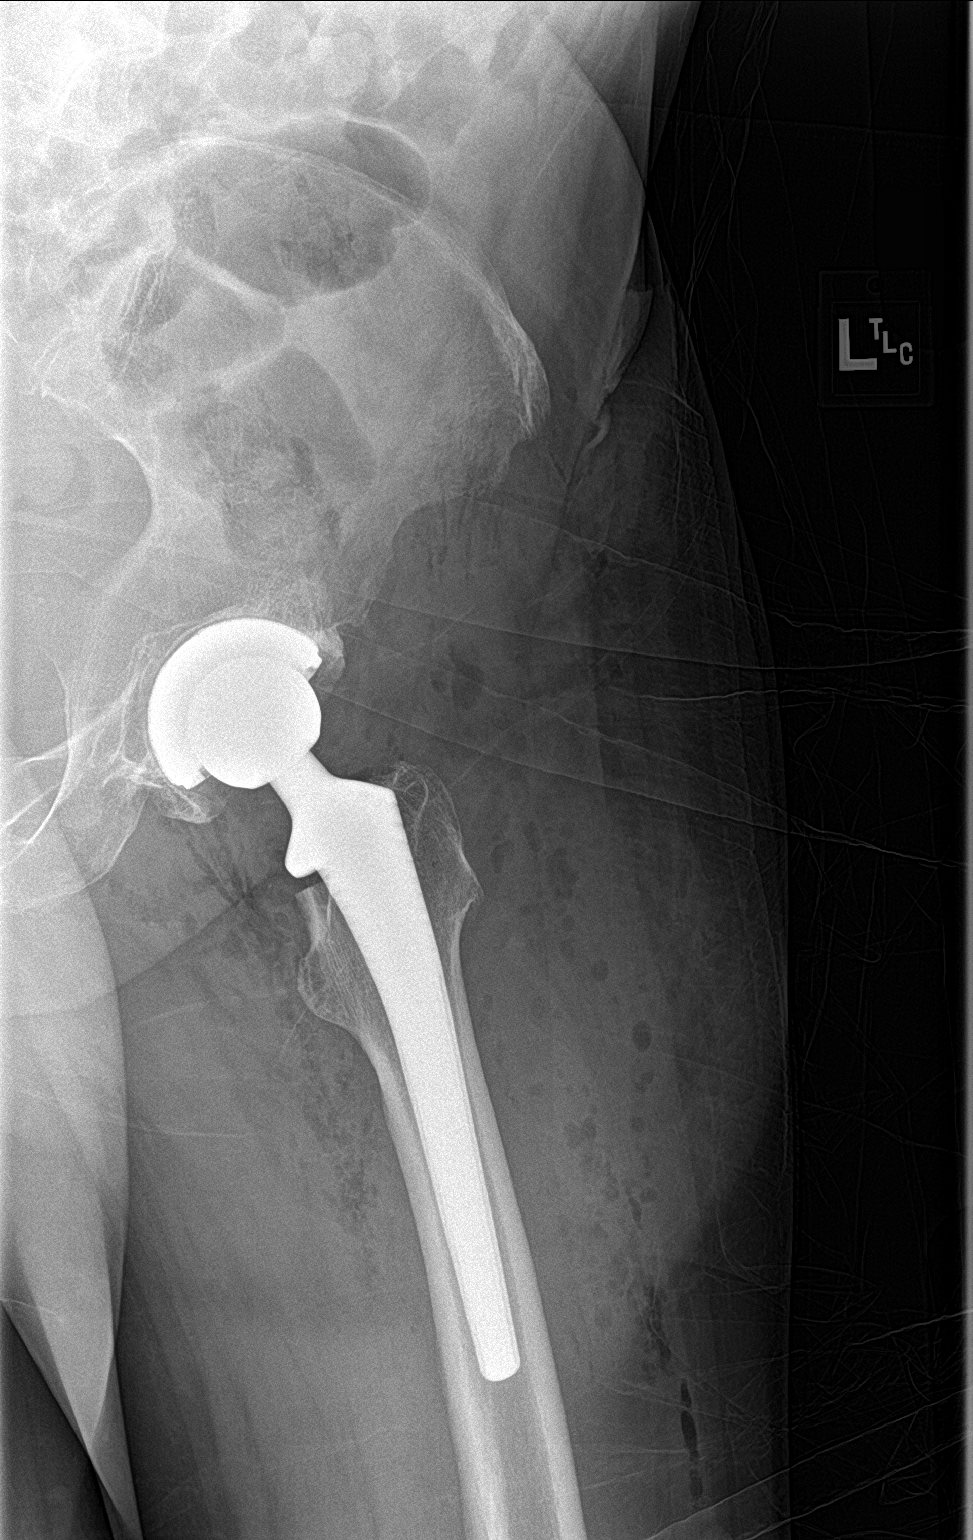

[3 of 3 positions shown; findings below may reference images not displayed]

FINDINGS: Marked arthritis of the right hip. Pubic symphysis and rami appear
intact. Patient is status post left hip replacement with normal
alignment. No fracture. Moderate gas in the soft tissues.
IMPRESSION: Status post left hip replacement with normal alignment. Moderate gas
in the soft tissues presumably related to recent surgery. No acute
osseous abnormality.

## 2020-02-16 ENCOUNTER — Other Ambulatory Visit: Payer: Self-pay | Admitting: Internal Medicine

## 2020-02-16 DIAGNOSIS — Z1231 Encounter for screening mammogram for malignant neoplasm of breast: Secondary | ICD-10-CM

## 2020-03-24 ENCOUNTER — Other Ambulatory Visit: Payer: Self-pay

## 2020-03-24 ENCOUNTER — Ambulatory Visit
Admission: RE | Admit: 2020-03-24 | Discharge: 2020-03-24 | Disposition: A | Discharge status: Home / Self Care | Payer: Commercial Managed Care - PPO | Source: Ambulatory Visit | Attending: Internal Medicine | Admitting: Internal Medicine

## 2020-03-24 DIAGNOSIS — Z1231 Encounter for screening mammogram for malignant neoplasm of breast: Secondary | ICD-10-CM

## 2020-06-14 IMAGING — MG DIGITAL SCREENING BILATERAL MAMMOGRAM WITH CAD
4 series · 4 of 4 positions shown · non-contrast
Comparison: Previous exam(s).

CLINICAL DATA: Screening.

EXAM:
DIGITAL SCREENING BILATERAL MAMMOGRAM WITH CAD

[R CC]
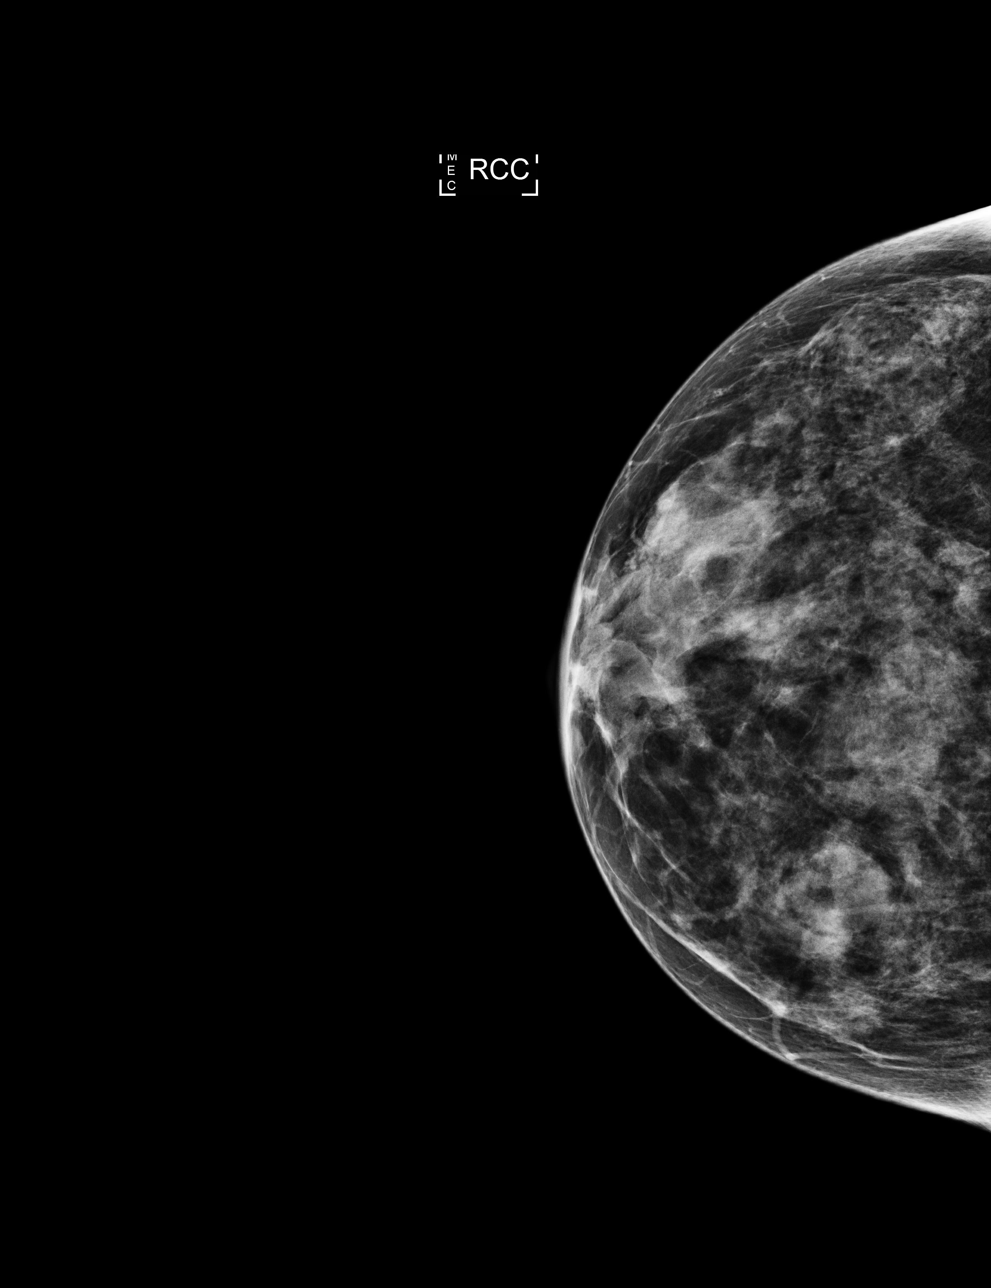

[L CC]
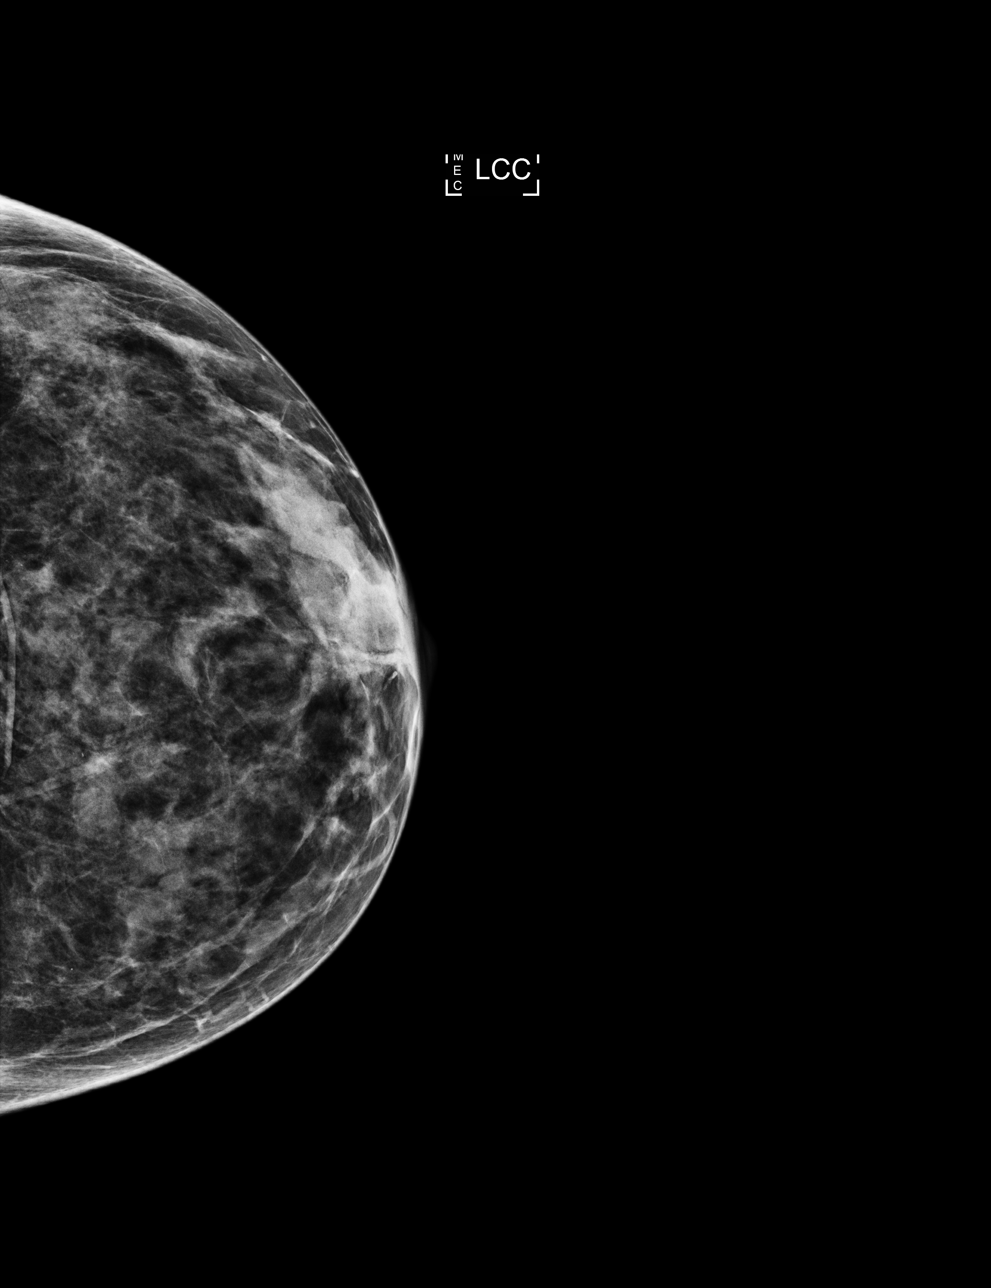

[L MLO]
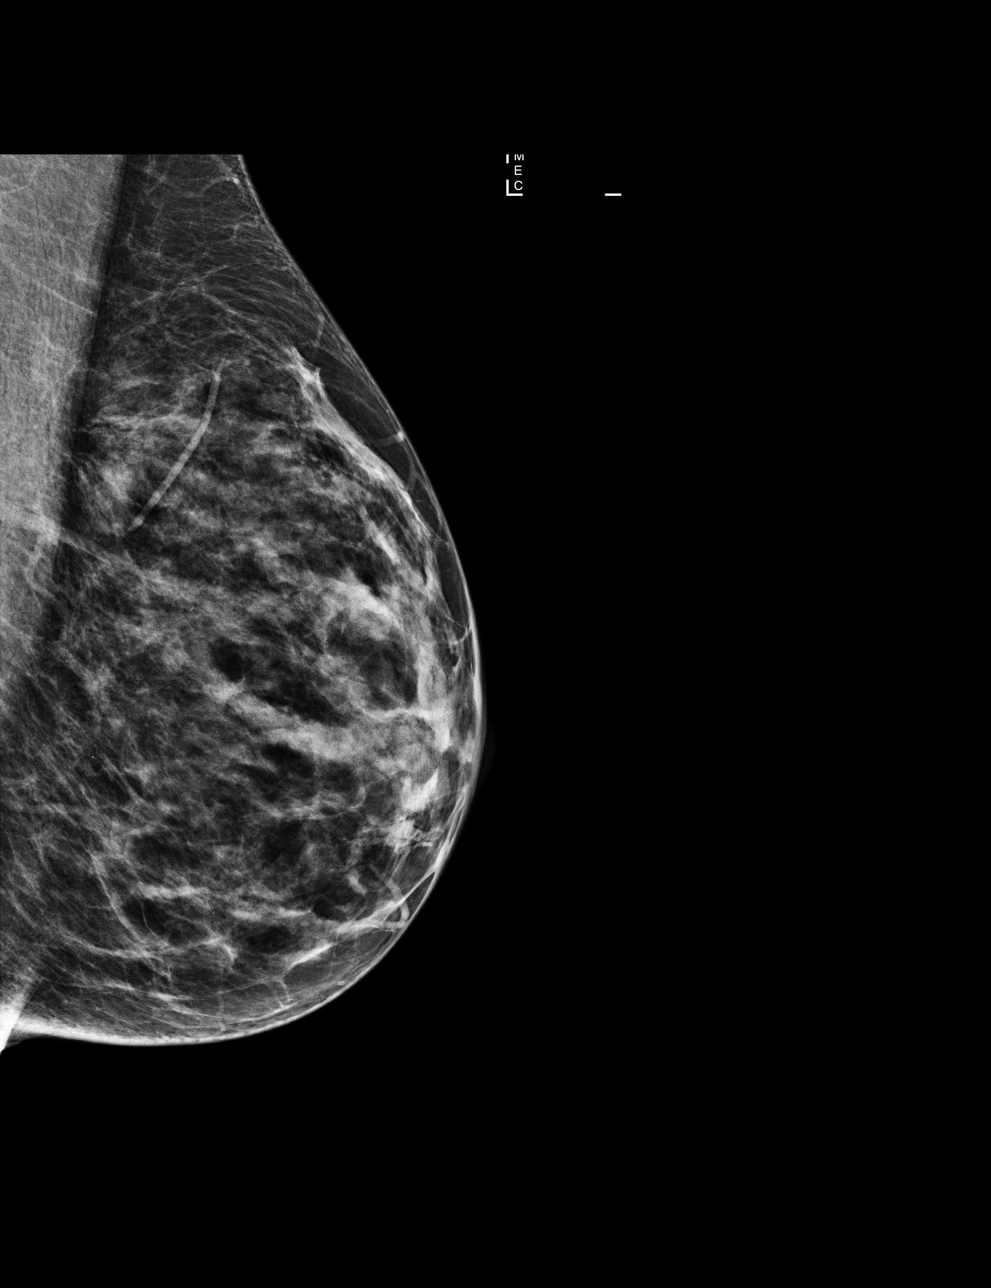

[R MLO]
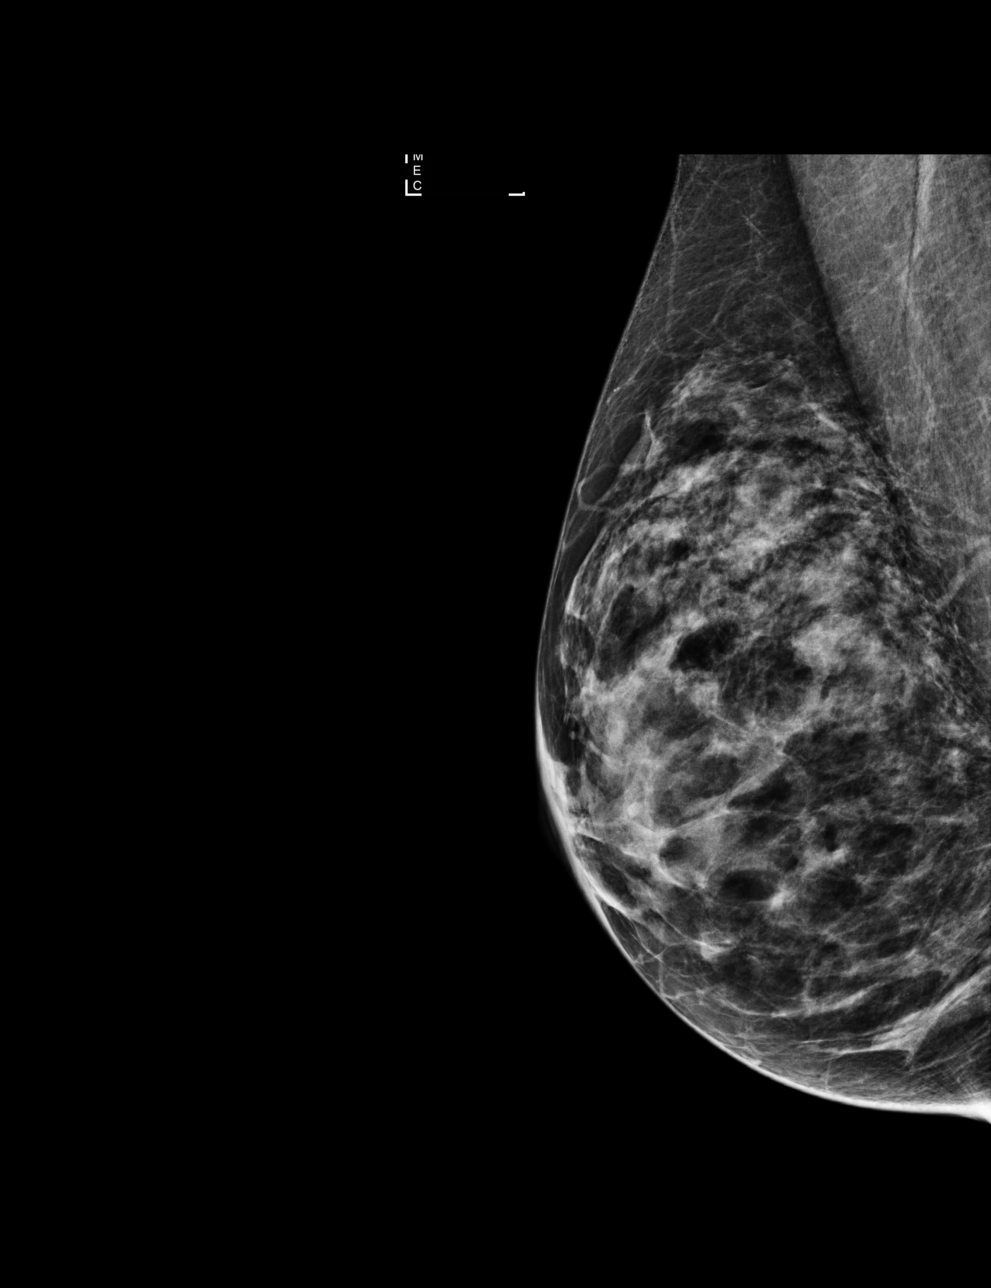

[4 of 4 positions shown; findings below may reference images not displayed]

ACR Breast Density Category d: The breast tissue is extremely dense,
which lowers the sensitivity of mammography.
FINDINGS: There are no findings suspicious for malignancy. Images were
processed with CAD.
IMPRESSION: No mammographic evidence of malignancy. A result letter of this
screening mammogram will be mailed directly to the patient.

RECOMMENDATION:
Screening mammogram in one year. (Code:[id number])

BI-RADS CATEGORY  1: Negative.

## 2020-08-30 ENCOUNTER — Other Ambulatory Visit: Payer: Self-pay | Admitting: Internal Medicine

## 2020-08-30 DIAGNOSIS — M810 Age-related osteoporosis without current pathological fracture: Secondary | ICD-10-CM

## 2021-03-03 ENCOUNTER — Ambulatory Visit
Admission: RE | Admit: 2021-03-03 | Discharge: 2021-03-03 | Disposition: A | Discharge status: Home / Self Care | Payer: BC Managed Care – PPO | Source: Ambulatory Visit | Attending: Internal Medicine | Admitting: Internal Medicine

## 2021-03-03 DIAGNOSIS — M810 Age-related osteoporosis without current pathological fracture: Secondary | ICD-10-CM

## 2021-05-26 ENCOUNTER — Other Ambulatory Visit: Payer: Self-pay | Admitting: Internal Medicine

## 2021-05-26 DIAGNOSIS — Z1231 Encounter for screening mammogram for malignant neoplasm of breast: Secondary | ICD-10-CM

## 2021-06-02 ENCOUNTER — Ambulatory Visit
Admission: RE | Admit: 2021-06-02 | Discharge: 2021-06-02 | Disposition: A | Discharge status: Home / Self Care | Payer: BC Managed Care – PPO | Source: Ambulatory Visit | Attending: Internal Medicine | Admitting: Internal Medicine

## 2021-06-02 DIAGNOSIS — Z1231 Encounter for screening mammogram for malignant neoplasm of breast: Secondary | ICD-10-CM

## 2021-07-11 ENCOUNTER — Other Ambulatory Visit: Payer: Self-pay | Admitting: Internal Medicine

## 2021-07-11 DIAGNOSIS — G43909 Migraine, unspecified, not intractable, without status migrainosus: Secondary | ICD-10-CM

## 2021-07-11 DIAGNOSIS — R413 Other amnesia: Secondary | ICD-10-CM

## 2021-07-24 ENCOUNTER — Other Ambulatory Visit: Payer: BC Managed Care – PPO

## 2021-08-01 ENCOUNTER — Other Ambulatory Visit: Payer: BC Managed Care – PPO

## 2021-08-07 ENCOUNTER — Ambulatory Visit
Admission: RE | Admit: 2021-08-07 | Discharge: 2021-08-07 | Disposition: A | Discharge status: Home / Self Care | Payer: BC Managed Care – PPO | Source: Ambulatory Visit | Attending: Internal Medicine | Admitting: Internal Medicine

## 2021-08-07 DIAGNOSIS — G43909 Migraine, unspecified, not intractable, without status migrainosus: Secondary | ICD-10-CM

## 2021-08-07 DIAGNOSIS — R413 Other amnesia: Secondary | ICD-10-CM

## 2021-08-07 MED ORDER — GADOBENATE DIMEGLUMINE 529 MG/ML IV SOLN
11.0000 mL | Freq: Once | INTRAVENOUS | Status: AC | PRN
  Administered 2021-08-07: 11 mL via INTRAVENOUS

## 2021-08-15 ENCOUNTER — Ambulatory Visit: Payer: Self-pay | Admitting: Neurology

## 2021-08-25 ENCOUNTER — Encounter: Payer: Self-pay | Admitting: Neurology

## 2021-08-25 ENCOUNTER — Ambulatory Visit (INDEPENDENT_AMBULATORY_CARE_PROVIDER_SITE_OTHER): Payer: BC Managed Care – PPO | Admitting: Neurology

## 2021-08-25 VITALS — BP 132/66 | HR 56 | Ht 61.0 in | Wt 121.5 lb

## 2021-08-25 DIAGNOSIS — I10 Essential (primary) hypertension: Secondary | ICD-10-CM | POA: Insufficient documentation

## 2021-08-25 DIAGNOSIS — R413 Other amnesia: Secondary | ICD-10-CM | POA: Insufficient documentation

## 2021-08-25 DIAGNOSIS — N949 Unspecified condition associated with female genital organs and menstrual cycle: Secondary | ICD-10-CM | POA: Insufficient documentation

## 2021-08-25 DIAGNOSIS — E890 Postprocedural hypothyroidism: Secondary | ICD-10-CM | POA: Insufficient documentation

## 2021-08-25 DIAGNOSIS — R202 Paresthesia of skin: Secondary | ICD-10-CM | POA: Insufficient documentation

## 2021-08-25 DIAGNOSIS — G5600 Carpal tunnel syndrome, unspecified upper limb: Secondary | ICD-10-CM | POA: Insufficient documentation

## 2021-08-25 DIAGNOSIS — N952 Postmenopausal atrophic vaginitis: Secondary | ICD-10-CM | POA: Insufficient documentation

## 2021-08-25 DIAGNOSIS — G3184 Mild cognitive impairment, so stated: Secondary | ICD-10-CM | POA: Diagnosis not present

## 2021-08-25 DIAGNOSIS — N393 Stress incontinence (female) (male): Secondary | ICD-10-CM | POA: Insufficient documentation

## 2021-08-25 DIAGNOSIS — F488 Other specified nonpsychotic mental disorders: Secondary | ICD-10-CM | POA: Insufficient documentation

## 2021-08-25 DIAGNOSIS — E039 Hypothyroidism, unspecified: Secondary | ICD-10-CM | POA: Insufficient documentation

## 2021-08-25 DIAGNOSIS — R209 Unspecified disturbances of skin sensation: Secondary | ICD-10-CM | POA: Insufficient documentation

## 2021-08-25 DIAGNOSIS — Z86010 Personal history of colonic polyps: Secondary | ICD-10-CM | POA: Insufficient documentation

## 2021-08-25 DIAGNOSIS — G8929 Other chronic pain: Secondary | ICD-10-CM | POA: Insufficient documentation

## 2021-08-25 DIAGNOSIS — M8580 Other specified disorders of bone density and structure, unspecified site: Secondary | ICD-10-CM | POA: Insufficient documentation

## 2021-08-25 DIAGNOSIS — M810 Age-related osteoporosis without current pathological fracture: Secondary | ICD-10-CM | POA: Insufficient documentation

## 2021-08-25 DIAGNOSIS — Z860101 Personal history of adenomatous and serrated colon polyps: Secondary | ICD-10-CM | POA: Insufficient documentation

## 2021-08-25 DIAGNOSIS — G43909 Migraine, unspecified, not intractable, without status migrainosus: Secondary | ICD-10-CM | POA: Insufficient documentation

## 2021-08-25 MED ORDER — VITAMIN B-12 1000 MCG PO TABS: 1000.0000 ug | ORAL_TABLET | Freq: Every day | ORAL | 3 refills | Status: AC

## 2021-08-25 NOTE — Patient Instructions (Signed)
Referral for a formal neuropsychiatry testing  Start B 12 supplement, 1000 mcg daily  Follow up in a year     There are well-accepted and sensible ways to reduce risk for Alzheimers disease and other degenerative brain disorders .  Exercise Daily Walk A daily 20 minute walk should be part of your routine. Disease related apathy can be a significant roadblock to exercise and the only way to overcome this is to make it a daily routine and perhaps have a reward at the end (something your loved one loves to eat or drink perhaps) or a personal trainer coming to the home can also be very useful. Most importantly, the patient is much more likely to exercise if the caregiver / spouse does it with him/her. In general a structured, repetitive schedule is best.  General Health: Any diseases which effect your body will effect your brain such as a pneumonia, urinary infection, blood clot, heart attack or stroke. Keep contact with your primary care doctor for regular follow ups.  Sleep. A good nights sleep is healthy for the brain. Seven hours is recommended. If you have insomnia or poor sleep habits we can give you some instructions. If you have sleep apnea wear your mask.  Diet: Eating a heart healthy diet is also a good idea; fish and poultry instead of red meat, nuts (mostly non-peanuts), vegetables, fruits, olive oil or canola oil (instead of butter), minimal salt (use other spices to flavor foods), whole grain rice, bread, cereal and pasta and wine in moderation.Research is now showing that the MIND diet, which is a combination of The Mediterranean diet and the DASH diet, is beneficial for cognitive processing and longevity. Information about this diet can be found in The MIND Diet, a book by Doyne Keel, MS, RDN, and online at [employer]bookDistributors.si  Finances, Power of Attorney and Advance Directives: You should consider putting legal safeguards in place with regard to financial and  medical decision making. While the spouse always has power of attorney for medical and financial issues in the absence of any form, you should consider what you want in case the spouse / caregiver is no longer around or capable of making decisions.

## 2021-08-25 NOTE — Progress Notes (Signed)
[facility]  PATIENT: Brandi Mitchell DOB: Jun 08, 1960  REQUESTING CLINICIAN: Clelland, Brandi Rend, PA HISTORY FROM: Patient and husband  REASON FOR VISIT: Memory problems    HISTORICAL  CHIEF COMPLAINT:  Chief Complaint  Patient presents with   New Patient (Initial Visit)    Rm 12. Accompanied by husband, Brandi Mitchell. NP/Paper proficient/Brandi Clelland PA [facility] [place]/progressive, worsening memory loss.    HISTORY OF PRESENT ILLNESS:  This is a 61 year old woman past medical history of hypothyroidism who is presenting with memory problem.  Patient described the memory problem as not remembering, being forgetful about recent conversation..  That she repeated herself, tell the same story over and over.  She also asked the same questions over and over.  She does have trouble with people's name but states this is not new.  Sometimes she will go to her room and forget what she was planning to initially.  She does work at [employer], she enjoys her job.  She reports her only issue is the problem with recall.  She is able to take care of house, pay her bills, drive, not being lost going to familiar places. She reported currently she is under a lot of stress that her father was diagnosed with cancer, it is expected to be terminal and she is going back and forth to [place] weekly.  Grandmother with Alzheimer dementia, diagnosed in her late 47s    TBI:   No past history of TBI Stroke:   no  past history of stroke Seizures:   no past history of seizures Sleep:   no history of sleep apnea.   Mood:   Reports of anxiety and depression due to her father's illness   Functional status: independent in all ADLs and IADLs Patient lives with husband . Cooking: patient, no incident   Cleaning: patient  Shopping: patient Bathing: patient, no help needed  Toileting: Patient, no help needed  Driving: patient, denies being lost  Bills: Patient, able to pay on time.   Ever  left the stove on by accident?: Denies  Forget how to use items around the house?: Denies  Getting lost going to familiar places?: Denies  Forgetting loved ones names?: Denies  Word finding difficulty? Denies  Sleep: Pretty good, at least 330 AM    OTHER MEDICAL CONDITIONS: Hypothyroidism   REVIEW OF SYSTEMS: Full 14 system review of systems performed and negative with exception of: as noted in the HPI  ALLERGIES: Allergies  Allergen Reactions   Oxycodone Hcl     Other reaction(s): hallucinatins and bad dreams, Other (See Comments)   Indomethacin Nausea Only   Keflex [Cephalexin] Nausea Only   Percocet [Oxycodone-Acetaminophen] Nausea Only   Tramadol Nausea Only    HOME MEDICATIONS: Outpatient Medications Prior to Visit  Medication Sig Dispense Refill   levothyroxine (SYNTHROID) 88 MCG tablet Take 1 tablet by mouth daily.     telmisartan (MICARDIS) 40 MG tablet Take 1 tablet by mouth daily.     apixaban (ELIQUIS) 2.5 MG TABS tablet Take 1 tablet (2.5 mg total) by mouth 2 (two) times daily. 60 tablet 0   bisacodyl (DULCOLAX) 5 MG EC tablet Take 1 tablet (5 mg total) by mouth daily as needed for moderate constipation. (Patient not taking: Reported on 10/11/2017) 10 tablet 0   Calcium Carb-Cholecalciferol (CALCIUM 600 + D PO) Take 1 tablet by mouth daily.     docusate sodium (COLACE) 100 MG capsule Take 1 capsule (100 mg total) by mouth  2 (two) times daily. (Patient not taking: Reported on 10/11/2017) 30 capsule 0   HYDROcodone-acetaminophen (NORCO/VICODIN) 5-325 MG tablet Take 1-2 tablets by mouth every 4 (four) hours as needed for severe pain ((score 7 to 10)). 40 tablet 0   levothyroxine (SYNTHROID, LEVOTHROID) 75 MCG tablet Take 75 mcg by mouth daily before breakfast.     promethazine (PHENERGAN) 12.5 MG tablet Take 1-2 tablets (12.5-25 mg total) by mouth every 6 (six) hours as needed for nausea or vomiting. 30 tablet 0   vitamin B-12 (CYANOCOBALAMIN) 1000 MCG tablet Take 1,000 mcg  by mouth daily.     No facility-administered medications prior to visit.    PAST MEDICAL HISTORY: Past Medical History:  Diagnosis Date   Degenerative joint disease (DJD) of hip    left hip   Heart murmur    "I have had it all my life but no issues with it"   Hypothyroidism    Migraine headache    Thyroid disease     PAST SURGICAL HISTORY: Past Surgical History:  Procedure Laterality Date   ABDOMINAL HYSTERECTOMY     BREAST EXCISIONAL BIOPSY Left    CESAREAN SECTION     FOOT SURGERY     TMJ ARTHROPLASTY     TONSILLECTOMY     TOTAL HIP ARTHROPLASTY Left 03/13/2017   Procedure: TOTAL HIP ARTHROPLASTY ANTERIOR APPROACH;  Surgeon: Melrose Nakayama, MD;  Location: [place];  Service: Orthopedics;  Laterality: Left;   TOTAL HIP ARTHROPLASTY Right 10/30/2017   Procedure: TOTAL HIP ARTHROPLASTY ANTERIOR APPROACH;  Surgeon: Melrose Nakayama, MD;  Location: [place];  Service: Orthopedics;  Laterality: Right;    FAMILY HISTORY: History reviewed. No pertinent family history.  SOCIAL HISTORY: Social History   Socioeconomic History   Marital status: Married    Spouse name: Not on file   Number of children: Not on file   Years of education: Not on file   Highest education level: Not on file  Occupational History   Not on file  Tobacco Use   Smoking status: Never   Smokeless tobacco: Never  Vaping Use   Vaping Use: Never used  Substance and Sexual Activity   Alcohol use: No   Drug use: No   Sexual activity: Not on file  Other Topics Concern   Not on file  Social History Narrative   Not on file   Social Determinants of Health   Financial Resource Strain: Not on file  Food Insecurity: Not on file  Transportation Needs: Not on file  Physical Activity: Not on file  Stress: Not on file  Social Connections: Not on file  Intimate Partner Violence: Not on file    PHYSICAL EXAM  GENERAL EXAM/CONSTITUTIONAL: Vitals:  Vitals:   08/25/21 0941  BP: 132/66  Pulse: (!) 56   Weight: 121 lb 8 oz (55.1 kg)  Height: '5\' 1"'$  (1.549 m)   Body mass index is 22.96 kg/m. Wt Readings from Last 3 Encounters:  08/25/21 121 lb 8 oz (55.1 kg)  10/30/17 117 lb (53.1 kg)  10/19/17 119 lb 12.8 oz (54.3 kg)   Patient is in no distress; well developed, nourished and groomed; neck is supple  EYES: Pupils round and reactive to light, Visual fields full to confrontation, Extraocular movements intacts,   MUSCULOSKELETAL: Gait, strength, tone, movements noted in Neurologic exam below  NEUROLOGIC: MENTAL STATUS:      No data to display            08/25/2021  9:47 AM  [employer] development officer (0/5) 4  Naming (0/3) 3  Attention: Read list of digits (0/2) 2  Attention: Read list of letters (0/1) 1  Attention: Serial 7 subtraction starting at 100 (0/3) 3  Language: Repeat phrase (0/2) 2  Language : Fluency (0/1) 1  Abstraction (0/2) 2  Delayed Recall (0/5) 1  Orientation (0/6) 6  Total 25  Adjusted Score (based on education) 25   CRANIAL NERVE:  2nd, 3rd, 4th, 6th - pupils equal and reactive to light, visual fields full to confrontation, extraocular muscles intact, no nystagmus 5th - facial sensation symmetric 7th - facial strength symmetric 8th - hearing intact 9th - palate elevates symmetrically, uvula midline 11th - shoulder shrug symmetric 12th - tongue protrusion midline  MOTOR:  normal bulk and tone, full strength in the BUE, BLE  SENSORY:  normal and symmetric to light touch  COORDINATION:  finger-nose-finger, fine finger movements normal  REFLEXES:  deep tendon reflexes present and symmetric  GAIT/STATION:  normal   DIAGNOSTIC DATA (LABS, IMAGING, TESTING) - I reviewed patient records, labs, notes, testing and imaging myself where available.  Lab Results  Component Value Date   WBC 6.0 10/19/2017   HGB 13.4 10/19/2017   HCT 42.5 10/19/2017   MCV 86.6 10/19/2017   PLT 279 10/19/2017      Component  Value Date/Time   NA 143 10/19/2017 0845   K 4.0 10/19/2017 0845   CL 107 10/19/2017 0845   CO2 29 10/19/2017 0845   GLUCOSE 97 10/19/2017 0845   BUN 13 10/19/2017 0845   CREATININE 0.73 10/19/2017 0845   CALCIUM 9.3 10/19/2017 0845   GFRNONAA >60 10/19/2017 0845   GFRAA >60 10/19/2017 0845   No results found for: "CHOL", "HDL", "LDLCALC", "LDLDIRECT", "TRIG", "CHOLHDL" No results found for: "HGBA1C" No results found for: "VITAMINB12" No results found for: "TSH"  Recent labs done on 522 show a TSH of 1.05, vitamin D level of 39.1 and vitamin B12 of 381.    ASSESSMENT AND PLAN  61 y.o. year old female with history of hypothyroidism who is presenting with memory decline, described as problem with recall, remembering, repeating herself and asking the same questions.  On exam today she scored a 25 out of 30 on the MoCA indicative of mild cognitive impairment.  Other than that patient is independent in all ADLs and IADLs.  She does however report some stress related to his father's health.  I do suspect the memory impairment may be related to anxiety, depression and increased stress.  However I will still send the patient for formal neuropsychological testing to obtain a baseline.  We will hold starting any medication at the moment until completion of the neuropsychological testing.  Discussed ways to decrease the risk of dementia including exercise, keeping a good sleep, good diet and good health.  I will see her in 1 year for follow-up or sooner if worse.  She is comfortable with plans.   1. Mild cognitive impairment      Patient Instructions  Referral for a formal neuropsychiatry testing  Start B 12 supplement, 1000 mcg daily  Follow up in a year     There are well-accepted and sensible ways to reduce risk for Alzheimers disease and other degenerative brain disorders .  Exercise Daily Walk A daily 20 minute walk should be part of your routine. Disease related apathy can be a  significant roadblock to exercise and the only way to overcome  this is to make it a daily routine and perhaps have a reward at the end (something your loved one loves to eat or drink perhaps) or a personal trainer coming to the home can also be very useful. Most importantly, the patient is much more likely to exercise if the caregiver / spouse does it with him/her. In general a structured, repetitive schedule is best.  General Health: Any diseases which effect your body will effect your brain such as a pneumonia, urinary infection, blood clot, heart attack or stroke. Keep contact with your primary care doctor for regular follow ups.  Sleep. A good nights sleep is healthy for the brain. Seven hours is recommended. If you have insomnia or poor sleep habits we can give you some instructions. If you have sleep apnea wear your mask.  Diet: Eating a heart healthy diet is also a good idea; fish and poultry instead of red meat, nuts (mostly non-peanuts), vegetables, fruits, olive oil or canola oil (instead of butter), minimal salt (use other spices to flavor foods), whole grain rice, bread, cereal and pasta and wine in moderation.Research is now showing that the MIND diet, which is a combination of The Mediterranean diet and the DASH diet, is beneficial for cognitive processing and longevity. Information about this diet can be found in The MIND Diet, a book by Doyne Keel, MS, RDN, and online at [employer]bookDist[employer].si  Finances, Power of Attorney and Advance Directives: You should consider putting legal safeguards in place with regard to financial and medical decision making. While the spouse always has power of attorney for medical and financial issues in the absence of any form, you should consider what you want in case the spouse / caregiver is no longer around or capable of making decisions.   Orders Placed This Encounter  Procedures   Ambulatory referral to Neuropsychology    Meds  ordered this encounter  Medications   vitamin B-12 (CYANOCOBALAMIN) 1000 MCG tablet    Sig: Take 1 tablet (1,000 mcg total) by mouth daily.    Dispense:  90 tablet    Refill:  3    Return in about 1 year (around 08/26/2022).  I have spent a total of 65 minutes dedicated to this patient today, preparing to see patient, performing a medically appropriate examination and evaluation, ordering tests and/or medications and procedures, and counseling and educating the patient/family/caregiver; independently interpreting result and communicating results to the family/patient/caregiver; and documenting clinical information in the electronic medical record.   Alric Ran, MD 08/25/2021, 4:43 PM  [facility] [address] [place], [place] [id number] [phone]

## 2021-08-31 ENCOUNTER — Telehealth: Payer: Self-pay | Admitting: Neurology

## 2021-08-31 NOTE — Telephone Encounter (Signed)
Referral for Neuropsychology sent to [facility] [phone]. 

## 2022-08-28 ENCOUNTER — Encounter: Payer: Self-pay | Admitting: Neurology

## 2022-08-28 ENCOUNTER — Ambulatory Visit (INDEPENDENT_AMBULATORY_CARE_PROVIDER_SITE_OTHER): Payer: Managed Care, Other (non HMO) | Admitting: Neurology

## 2022-08-28 VITALS — BP 128/70 | HR 61 | Ht 61.0 in | Wt 119.0 lb

## 2022-08-28 DIAGNOSIS — G3184 Mild cognitive impairment, so stated: Secondary | ICD-10-CM | POA: Diagnosis not present

## 2022-08-28 NOTE — Progress Notes (Signed)
[facility]  PATIENT: Brandi Mitchell DOB: 1961-01-01  REQUESTING CLINICIAN: Kirby Funk, MD HISTORY FROM: Patient and husband  REASON FOR VISIT: Memory problems    HISTORICAL  CHIEF COMPLAINT:  Chief Complaint  Patient presents with   Follow-up    Rm 13, memory f/u, feels she is having a harder time remembering and is more forgetful. She admits frustration with this.    INTERVAL HISTORY 08/28/2022:  Patient presents today for follow-up, last visit was a year ago.  Since then she feels that her memory is getting worse.  She has difficulty with short-term memory.  She has to take a lot of notes otherwise she will forget.  She has difficulty performing her job at time due to forgetfulness.  She reports her husband feels like she repeats herself and asks the same questions.  She reports that she is able to perform her normal ADLs, she is able to do her job but again has to write things down or else she will forget.  At last visit we have recommended her a formal neuropsychological testing but this has not been completed.  Reports that her sleep is okay, even though she has to wake up very early in the morning to go to work.  Denies being lost in familiar places, denies leaving the stove on and denies difficulty with using items around the house.  She also reports losing her father back in October.    HISTORY OF PRESENT ILLNESS:  This is a 62 year old woman past medical history of hypothyroidism who is presenting with memory problem.  Patient described the memory problem as not remembering, being forgetful about recent conversation..  That she repeated herself, tell the same story over and over.  She also asked the same questions over and over.  She does have trouble with people's name but states this is not new.  Sometimes she will go to her room and forget what she was planning to initially.  She does work at [employer], she enjoys her job.  She reports her only issue is  the problem with recall.  She is able to take care of house, pay her bills, drive, not being lost going to familiar places. She reported currently she is under a lot of stress that her father was diagnosed with cancer, it is expected to be terminal and she is going back and forth to [place] weekly.  Grandmother with Alzheimer dementia, diagnosed in her late 47s    TBI:   No past history of TBI Stroke:   no  past history of stroke Seizures:   no past history of seizures Sleep:   no history of sleep apnea.   Mood:   Reports of anxiety and depression due to her father's illness   Functional status: independent in all ADLs and IADLs Patient lives with husband . Cooking: patient, no incident   Cleaning: patient  Shopping: patient Bathing: patient, no help needed  Toileting: Patient, no help needed  Driving: patient, denies being lost  Bills: Patient, able to pay on time.   Ever left the stove on by accident?: Denies  Forget how to use items around the house?: Denies  Getting lost going to familiar places?: Denies  Forgetting loved ones names?: Denies  Word finding difficulty? Denies  Sleep: Pretty good, at least 330 AM    OTHER MEDICAL CONDITIONS: Hypothyroidism   REVIEW OF SYSTEMS: Full 14 system review of systems performed and negative with exception of: as noted in  the HPI  ALLERGIES: Allergies  Allergen Reactions   Oxycodone Hcl     Other reaction(s): hallucinatins and bad dreams, Other (See Comments)   Indomethacin Nausea Only   Keflex [Cephalexin] Nausea Only   Percocet [Oxycodone-Acetaminophen] Nausea Only   Tramadol Nausea Only    HOME MEDICATIONS: Outpatient Medications Prior to Visit  Medication Sig Dispense Refill   amLODipine (NORVASC) 5 MG tablet Take 5 mg by mouth daily.     Cholecalciferol (VITAMIN D-3 PO) Take 1 tablet by mouth in the morning.     cyanocobalamin 1000 MCG tablet Take 1,000 mcg by mouth daily.     levothyroxine (SYNTHROID) 88 MCG tablet  Take 1 tablet by mouth daily.     Omega-3 Fatty Acids (FISH OIL CONCENTRATE PO) Take 700 mg by mouth in the morning.     OVER THE COUNTER MEDICATION Take 1 tablet by mouth 2 (two) times daily.     telmisartan (MICARDIS) 40 MG tablet Take 1 tablet by mouth daily.     No facility-administered medications prior to visit.    PAST MEDICAL HISTORY: Past Medical History:  Diagnosis Date   Degenerative joint disease (DJD) of hip    left hip   Heart murmur    "I have had it all my life but no issues with it"   Hypothyroidism    Migraine headache    Thyroid disease     PAST SURGICAL HISTORY: Past Surgical History:  Procedure Laterality Date   ABDOMINAL HYSTERECTOMY     BREAST EXCISIONAL BIOPSY Left    CESAREAN SECTION     FOOT SURGERY     TMJ ARTHROPLASTY     TONSILLECTOMY     TOTAL HIP ARTHROPLASTY Left 03/13/2017   Procedure: TOTAL HIP ARTHROPLASTY ANTERIOR APPROACH;  Surgeon: Marcene Corning, MD;  Location: [facility] OR;  Service: Orthopedics;  Laterality: Left;   TOTAL HIP ARTHROPLASTY Right 10/30/2017   Procedure: TOTAL HIP ARTHROPLASTY ANTERIOR APPROACH;  Surgeon: Marcene Corning, MD;  Location: [facility] OR;  Service: Orthopedics;  Laterality: Right;   TRIGGER FINGER RELEASE Left     FAMILY HISTORY: History reviewed. No pertinent family history.  SOCIAL HISTORY: Social History   Socioeconomic History   Marital status: Married    Spouse name: Not on file   Number of children: Not on file   Years of education: Not on file   Highest education level: Not on file  Occupational History   Not on file  Tobacco Use   Smoking status: Never   Smokeless tobacco: Never  Vaping Use   Vaping Use: Never used  Substance and Sexual Activity   Alcohol use: No   Drug use: No   Sexual activity: Not on file  Other Topics Concern   Not on file  Social History Narrative   Right handed    Caffeine- cups daily   Lives with husband    Social Determinants of Health   Financial Resource Strain:  Not on file  Food Insecurity: Not on file  Transportation Needs: Not on file  Physical Activity: Not on file  Stress: Not on file  Social Connections: Not on file  Intimate Partner Violence: Not on file    PHYSICAL EXAM  GENERAL EXAM/CONSTITUTIONAL: Vitals:  Vitals:   08/28/22 1007  BP: 128/70  Pulse: 61  SpO2: 97%  Weight: 119 lb (54 kg)  Height: 5\' 1"  (1.549 m)    Body mass index is 22.48 kg/m. Wt Readings from Last 3 Encounters:  08/28/22 119 lb (  54 kg)  08/25/21 121 lb 8 oz (55.1 kg)  10/30/17 117 lb (53.1 kg)   Patient is in no distress; well developed, nourished and groomed; neck is supple  MUSCULOSKELETAL: Gait, strength, tone, movements noted in Neurologic exam below  NEUROLOGIC: MENTAL STATUS:      No data to display            08/28/2022   10:15 AM 08/25/2021    9:47 AM  Montreal Cognitive Assessment   Visuospatial/ Executive (0/5) 5 4  Naming (0/3) 3 3  Attention: Read list of digits (0/2) 2 2  Attention: Read list of letters (0/1) 1 1  Attention: Serial 7 subtraction starting at 100 (0/3) 1 3  Language: Repeat phrase (0/2) 2 2  Language : Fluency (0/1) 1 1  Abstraction (0/2) 2 2  Delayed Recall (0/5) 1 1  Orientation (0/6) 5 6  Total 23 25  Adjusted Score (based on education)  25   CRANIAL NERVE:  2nd, 3rd, 4th, 6th - pupils equal and reactive to light, visual fields full to confrontation, extraocular muscles intact, no nystagmus 5th - facial sensation symmetric 7th - facial strength symmetric 8th - hearing intact 9th - palate elevates symmetrically, uvula midline 11th - shoulder shrug symmetric 12th - tongue protrusion midline  MOTOR:  normal bulk and tone, full strength in the BUE, BLE  SENSORY:  normal and symmetric to light touch  COORDINATION:  finger-nose-finger, fine finger movements normal  REFLEXES:  deep tendon reflexes present and symmetric  GAIT/STATION:  normal   DIAGNOSTIC DATA (LABS, IMAGING, TESTING) - I  reviewed patient records, labs, notes, testing and imaging myself where available.  Lab Results  Component Value Date   WBC 6.0 10/19/2017   HGB 13.4 10/19/2017   HCT 42.5 10/19/2017   MCV 86.6 10/19/2017   PLT 279 10/19/2017      Component Value Date/Time   NA 143 10/19/2017 0845   K 4.0 10/19/2017 0845   CL 107 10/19/2017 0845   CO2 29 10/19/2017 0845   GLUCOSE 97 10/19/2017 0845   BUN 13 10/19/2017 0845   CREATININE 0.73 10/19/2017 0845   CALCIUM 9.3 10/19/2017 0845   GFRNONAA >60 10/19/2017 0845   GFRAA >60 10/19/2017 0845   No results found for: "CHOL", "HDL", "LDLCALC", "LDLDIRECT", "TRIG", "CHOLHDL" No results found for: "HGBA1C" No results found for: "VITAMINB12" No results found for: "TSH"  Recent labs done on 522 show a TSH of 1.05, vitamin D level of 39.1 and vitamin B12 of 381.    ASSESSMENT AND PLAN  62 y.o. year old female with history of hypothyroidism who is presenting for follow up for cognitive impairment.  She feels like her memory is getting worse, today on exam, she scored 23 out of 30 on the MoCA.  Plan for now is to obtain ATN profile with B12 level, I will also refer the patient back for formal neurologic neuropsychological testing.  I have informed her to contact us if she has not heard anything in 4 to 6 weeks.  I will contact her to go over the results We also discussed ways to decrease the risk of dementia including exercise, keeping a good sleep, good diet and good health.  I will see her in 1 year for follow-up or sooner if worse.  She is comfortable with plans.   1. Mild cognitive impairment       Patient Instructions  ATN profile  B 12 level  Continue current medications  Referral  for formal neuropsychological testing  Return in a year    There are well-accepted and sensible ways to reduce risk for Alzheimers disease and other degenerative brain disorders .  Exercise Daily Walk A daily 20 minute walk should be part of your routine.  Disease related apathy can be a significant roadblock to exercise and the only way to overcome this is to make it a daily routine and perhaps have a reward at the end (something your loved one loves to eat or drink perhaps) or a personal trainer coming to the home can also be very useful. Most importantly, the patient is much more likely to exercise if the caregiver / spouse does it with him/her. In general a structured, repetitive schedule is best.  General Health: Any diseases which effect your body will effect your brain such as a pneumonia, urinary infection, blood clot, heart attack or stroke. Keep contact with your primary care doctor for regular follow ups.  Sleep. A good nights sleep is healthy for the brain. Seven hours is recommended. If you have insomnia or poor sleep habits we can give you some instructions. If you have sleep apnea wear your mask.  Diet: Eating a heart healthy diet is also a good idea; fish and poultry instead of red meat, nuts (mostly non-peanuts), vegetables, fruits, olive oil or canola oil (instead of butter), minimal salt (use other spices to flavor foods), whole grain rice, bread, cereal and pasta and wine in moderation.Research is now showing that the MIND diet, which is a combination of The Mediterranean diet and the DASH diet, is beneficial for cognitive processing and longevity. Information about this diet can be found in The MIND Diet, a book by Alonna Minium, MS, RDN, and online at [employer]  Finances, Power of [address] and Advance Directives: You should consider putting legal safeguards in place with regard to financial and medical decision making. While the spouse always has power of attorney for medical and financial issues in the absence of any form, you should consider what you want in case the spouse / caregiver is no longer around or capable of making decisions.   Orders Placed This Encounter  Procedures   ATN PROFILE    Vitamin B12   Ambulatory referral to Neuropsychology    No orders of the defined types were placed in this encounter.   Return in about 1 year (around 08/28/2023).   Windell Norfolk, MD 08/28/2022, 10:44 AM  [facility] [address] [place], [place] [address] [phone]

## 2022-08-28 NOTE — Patient Instructions (Signed)
ATN profile  B 12 level  Continue current medications  Referral for formal neuropsychological testing  Return in a year    There are well-accepted and sensible ways to reduce risk for Alzheimers disease and other degenerative brain disorders .  Exercise Daily Walk A daily 20 minute walk should be part of your routine. Disease related apathy can be a significant roadblock to exercise and the only way to overcome this is to make it a daily routine and perhaps have a reward at the end (something your loved one loves to eat or drink perhaps) or a personal trainer coming to the home can also be very useful. Most importantly, the patient is much more likely to exercise if the caregiver / spouse does it with him/her. In general a structured, repetitive schedule is best.  General Health: Any diseases which effect your body will effect your brain such as a pneumonia, urinary infection, blood clot, heart attack or stroke. Keep contact with your primary care doctor for regular follow ups.  Sleep. A good nights sleep is healthy for the brain. Seven hours is recommended. If you have insomnia or poor sleep habits we can give you some instructions. If you have sleep apnea wear your mask.  Diet: Eating a heart healthy diet is also a good idea; fish and poultry instead of red meat, nuts (mostly non-peanuts), vegetables, fruits, olive oil or canola oil (instead of butter), minimal salt (use other spices to flavor foods), whole grain rice, bread, cereal and pasta and wine in moderation.Research is now showing that the MIND diet, which is a combination of The Mediterranean diet and the DASH diet, is beneficial for cognitive processing and longevity. Information about this diet can be found in The MIND Diet, a book by Alonna Minium, MS, RDN, and online at [employer]  Finances, Power of [address] and Advance Directives: You should consider putting legal safeguards in place with regard to  financial and medical decision making. While the spouse always has power of attorney for medical and financial issues in the absence of any form, you should consider what you want in case the spouse / caregiver is no longer around or capable of making decisions.

## 2022-08-29 LAB — ATN PROFILE

## 2022-08-30 LAB — VITAMIN B12: Vitamin B-12: >2000 pg/mL — ABNORMAL HIGH (ref 232–1245)

## 2022-08-30 LAB — ATN PROFILE

## 2022-08-31 LAB — ATN PROFILE
A -- Beta-amyloid 42/40 Ratio: 0.087 — ABNORMAL LOW (ref >0.102)
N -- NfL, Plasma: 1.43 pg/mL (ref 0.00–4.61)

## 2023-02-12 ENCOUNTER — Telehealth: Payer: Self-pay | Admitting: Neurology

## 2023-02-12 NOTE — Telephone Encounter (Signed)
I recommend we pursue neurpsychological evaluation as planned by Dr. Teresa Coombs. I do not think patient would benefit from a sooner appt at this time. I will copy Dr. Teresa Coombs so he can review for need for sooner appt before July 2025.

## 2023-02-12 NOTE — Telephone Encounter (Signed)
Resent referral from 08/2022 to Dr. Kieth Brightly, it doesn't look like referral was ever sent. Phone # [phone]

## 2023-04-24 ENCOUNTER — Telehealth: Payer: Self-pay | Admitting: Neurology

## 2023-04-24 NOTE — Telephone Encounter (Signed)
 Referral for neuropsychology sent through [facility] to [facility] to see Dr. Arley Phenix. Phone: [phone]

## 2023-05-16 ENCOUNTER — Other Ambulatory Visit: Payer: Self-pay | Admitting: Neurology

## 2023-05-16 DIAGNOSIS — G3184 Mild cognitive impairment, so stated: Secondary | ICD-10-CM

## 2023-05-16 NOTE — Telephone Encounter (Signed)
 Dr. Marvetta Gibbons office requesting a new referral for neuropsychology due to one sent was dated July 2024. Could you create a new referral?

## 2023-05-16 NOTE — Telephone Encounter (Signed)
 Refax and sent through [facility] referral for neuropsychology to [facility]. Phone: [phone], Fax: [phone]

## 2023-05-16 NOTE — Telephone Encounter (Signed)
 New referral ordered

## 2023-08-28 ENCOUNTER — Ambulatory Visit: Payer: Managed Care, Other (non HMO) | Admitting: Neurology

## 2023-11-02 ENCOUNTER — Other Ambulatory Visit (HOSPITAL_COMMUNITY): Payer: Self-pay

## 2023-11-02 DIAGNOSIS — R229 Localized swelling, mass and lump, unspecified: Secondary | ICD-10-CM

## 2023-11-05 ENCOUNTER — Ambulatory Visit (HOSPITAL_COMMUNITY)
Admission: RE | Admit: 2023-11-05 | Discharge: 2023-11-05 | Disposition: A | Discharge status: Home / Self Care | Source: Ambulatory Visit

## 2023-11-05 DIAGNOSIS — R229 Localized swelling, mass and lump, unspecified: Secondary | ICD-10-CM | POA: Diagnosis present

## 2023-11-12 ENCOUNTER — Other Ambulatory Visit (HOSPITAL_COMMUNITY): Payer: Self-pay

## 2023-11-12 DIAGNOSIS — R9389 Abnormal findings on diagnostic imaging of other specified body structures: Secondary | ICD-10-CM

## 2023-11-12 DIAGNOSIS — R229 Localized swelling, mass and lump, unspecified: Secondary | ICD-10-CM

## 2023-11-16 ENCOUNTER — Ambulatory Visit (HOSPITAL_COMMUNITY)

## 2023-11-24 ENCOUNTER — Ambulatory Visit (HOSPITAL_BASED_OUTPATIENT_CLINIC_OR_DEPARTMENT_OTHER)
Admission: RE | Admit: 2023-11-24 | Discharge: 2023-11-24 | Disposition: A | Discharge status: Home / Self Care | Source: Ambulatory Visit

## 2023-11-24 DIAGNOSIS — R229 Localized swelling, mass and lump, unspecified: Secondary | ICD-10-CM | POA: Insufficient documentation

## 2023-11-24 DIAGNOSIS — R9389 Abnormal findings on diagnostic imaging of other specified body structures: Secondary | ICD-10-CM | POA: Insufficient documentation

## 2023-11-24 LAB — POCT I-STAT CREATININE: Creatinine, Ser: 0.9 mg/dL (ref 0.44–1.00)

## 2023-11-24 MED ORDER — IOHEXOL 300 MG/ML  SOLN
100.0000 mL | Freq: Once | INTRAMUSCULAR | Status: AC | PRN
  Administered 2023-11-24: 100 mL via INTRAVENOUS

## 2023-12-06 ENCOUNTER — Other Ambulatory Visit (HOSPITAL_COMMUNITY): Payer: Self-pay | Admitting: Medical

## 2023-12-06 DIAGNOSIS — R590 Localized enlarged lymph nodes: Secondary | ICD-10-CM

## 2023-12-06 NOTE — Progress Notes (Unsigned)
 Brandi Aran, MD  [name] OK for US  guided biopsy of left inguinal LN under local only.  GY       Previous Messages    ----- Message ----- From: [name] Sent: 12/06/2023   3:54 PM EDT To: Channing CROME Baldwin; Ir Procedure Requests Subject: US  CORE BIOPSY (LYMPH NODES)                  Procedure :US  CORE BIOPSY (LYMPH NODES)  Reason :Enlarged left inguinal lymph node. Dx: Localized enlarged lymph nodes [R59.0 (ICD-10-CM)]    History :There is a CT ABD-PELV W/CM  on Oct 4,225 and a US  EXTREM LOW*L* LIMITED on Sept 15,2025 both in PACS under  Pt ID: [id number] [facility]  Provider: Alvia Toribio Redbird, PA-C  Provider contact ; [phone]

## 2023-12-14 ENCOUNTER — Encounter: Payer: Self-pay | Admitting: *Deleted

## 2023-12-14 DIAGNOSIS — R591 Generalized enlarged lymph nodes: Secondary | ICD-10-CM

## 2023-12-14 NOTE — Progress Notes (Signed)
 This encounter was created in error - please disregard.

## 2023-12-20 ENCOUNTER — Encounter: Payer: Self-pay | Admitting: General Surgery

## 2023-12-20 ENCOUNTER — Ambulatory Visit (INDEPENDENT_AMBULATORY_CARE_PROVIDER_SITE_OTHER): Admitting: General Surgery

## 2023-12-20 VITALS — BP 132/75 | HR 55 | Temp 97.8°F | Resp 16 | Ht 61.0 in | Wt 128.0 lb

## 2023-12-20 DIAGNOSIS — R599 Enlarged lymph nodes, unspecified: Secondary | ICD-10-CM

## 2023-12-20 DIAGNOSIS — K649 Unspecified hemorrhoids: Secondary | ICD-10-CM | POA: Diagnosis not present

## 2023-12-21 NOTE — H&P (Signed)
 [address] Brandi Mitchell; [id number]; 1961/02/01   HPI Patient is a 63 year old white female who was referred to my care by [facility] for evaluation and treatment of a nonresolving left inguinal lymph node.  This is been followed by both ultrasound and CT scan.  There is approximately 3.8 cm in size.  She denies any infection or cellulitis to the left leg.  She denies any fever or chills.  She also complains about a swollen hemorrhoid which is causing her some discomfort.  She notices blood on the toilet paper when she wipes herself.  She has had a colonoscopy in the recent past.  She denies any constipation. Past Medical History:  Diagnosis Date   Degenerative joint disease (DJD) of hip    left hip   Heart murmur    I have had it all my life but no issues with it   Hypothyroidism    Migraine headache    Thyroid  disease     Past Surgical History:  Procedure Laterality Date   ABDOMINAL HYSTERECTOMY     BREAST EXCISIONAL BIOPSY Left    CESAREAN SECTION     FOOT SURGERY     TMJ ARTHROPLASTY     TONSILLECTOMY     TOTAL HIP ARTHROPLASTY Left 03/13/2017   Procedure: TOTAL HIP ARTHROPLASTY ANTERIOR APPROACH;  Surgeon: Sheril Coy, MD;  Location: [facility] OR;  Service: Orthopedics;  Laterality: Left;   TOTAL HIP ARTHROPLASTY Right 10/30/2017   Procedure: TOTAL HIP ARTHROPLASTY ANTERIOR APPROACH;  Surgeon: Sheril Coy, MD;  Location: [facility] OR;  Service: Orthopedics;  Laterality: Right;   TRIGGER FINGER RELEASE Left     History reviewed. No pertinent family history.  Current Outpatient Medications on File Prior to Visit  Medication Sig Dispense Refill   amLODipine (NORVASC) 5 MG tablet Take 5 mg by mouth daily.     Calcium-Magnesium-Vitamin D (CITRACAL CALCIUM +D3) 600-40-500 MG-MG-UNIT TB24 Take by mouth.     Cholecalciferol (VITAMIN D-3 PO) Take 1 tablet by mouth in the morning.     cyanocobalamin 1000 MCG tablet Take 1,000 mcg by mouth daily.     levothyroxine  (SYNTHROID ) 88 MCG tablet  Take 1 tablet by mouth daily.     Omega-3 Fatty Acids (FISH OIL CONCENTRATE PO) Take 700 mg by mouth in the morning.     telmisartan (MICARDIS) 40 MG tablet Take 1 tablet by mouth daily.     No current facility-administered medications on file prior to visit.    Allergies  Allergen Reactions   Oxycodone Hcl     Other reaction(s): hallucinatins and bad dreams, Other (See Comments)   Indomethacin Nausea Only   Keflex [Cephalexin] Nausea Only   Percocet [Oxycodone-Acetaminophen ] Nausea Only   Tramadol Nausea Only    Social History   Substance and Sexual Activity  Alcohol Use No    Social History   Tobacco Use  Smoking Status Never  Smokeless Tobacco Never    Review of Systems  Constitutional: Negative.   HENT: Negative.    Eyes: Negative.   Respiratory: Negative.    Cardiovascular: Negative.   Gastrointestinal: Negative.   Genitourinary: Negative.   Musculoskeletal: Negative.   Skin: Negative.   Neurological: Negative.   Endo/Heme/Allergies: Negative.   Psychiatric/Behavioral: Negative.      Objective   Vitals:   12/20/23 0901  BP: 132/75  Pulse: (!) 55  Resp: 16  Temp: 97.8 F (36.6 C)  SpO2: 96%    Physical Exam Vitals reviewed.  Constitutional:  Appearance: Normal appearance. She is normal weight. She is not ill-appearing.  HENT:     Head: Normocephalic and atraumatic.  Cardiovascular:     Rate and Rhythm: Normal rate and regular rhythm.     Heart sounds: Normal heart sounds. No murmur heard.    No friction rub. No gallop.  Pulmonary:     Effort: Pulmonary effort is normal. No respiratory distress.     Breath sounds: Normal breath sounds. No stridor. No wheezing, rhonchi or rales.  Abdominal:     General: Bowel sounds are normal. There is no distension.     Palpations: Abdomen is soft. There is no mass.     Tenderness: There is no abdominal tenderness. There is no guarding or rebound.     Hernia: No hernia is present.     Comments:  Enlarged left inguinal lymph node.  No hernias appreciated.  Genitourinary:    Comments: Inflamed internal and external hemorrhoid at the 5 o'clock position.  No frank blood noted.  Sphincter tone normal.  No thrombus present. Musculoskeletal:     Cervical back: Normal range of motion and neck supple.  Lymphadenopathy:     Cervical: No cervical adenopathy.  Skin:    General: Skin is warm and dry.  Neurological:     Mental Status: She is alert and oriented to person, place, and time.   No axillary or supraclavicular lymphadenopathy.  Assessment  Left inguinal lymphadenopathy Internal and external inflamed hemorrhoid Plan  Patient is scheduled for a left inguinal lymph node biopsy and hemorrhoidectomy on 01/02/2024.  The risks and benefits of the procedure including bleeding, infection, and the possibility of recurrence of the hemorrhoidal disease were fully explained to the patient, who gave informed consent.

## 2023-12-21 NOTE — Progress Notes (Signed)
 [address] Brandi Mitchell; [id number]; 1961/02/01   HPI Patient is a 63 year old white female who was referred to my care by [facility] for evaluation and treatment of a nonresolving left inguinal lymph node.  This is been followed by both ultrasound and CT scan.  There is approximately 3.8 cm in size.  She denies any infection or cellulitis to the left leg.  She denies any fever or chills.  She also complains about a swollen hemorrhoid which is causing her some discomfort.  She notices blood on the toilet paper when she wipes herself.  She has had a colonoscopy in the recent past.  She denies any constipation. Past Medical History:  Diagnosis Date   Degenerative joint disease (DJD) of hip    left hip   Heart murmur    I have had it all my life but no issues with it   Hypothyroidism    Migraine headache    Thyroid  disease     Past Surgical History:  Procedure Laterality Date   ABDOMINAL HYSTERECTOMY     BREAST EXCISIONAL BIOPSY Left    CESAREAN SECTION     FOOT SURGERY     TMJ ARTHROPLASTY     TONSILLECTOMY     TOTAL HIP ARTHROPLASTY Left 03/13/2017   Procedure: TOTAL HIP ARTHROPLASTY ANTERIOR APPROACH;  Surgeon: Sheril Coy, MD;  Location: [facility] OR;  Service: Orthopedics;  Laterality: Left;   TOTAL HIP ARTHROPLASTY Right 10/30/2017   Procedure: TOTAL HIP ARTHROPLASTY ANTERIOR APPROACH;  Surgeon: Sheril Coy, MD;  Location: [facility] OR;  Service: Orthopedics;  Laterality: Right;   TRIGGER FINGER RELEASE Left     History reviewed. No pertinent family history.  Current Outpatient Medications on File Prior to Visit  Medication Sig Dispense Refill   amLODipine (NORVASC) 5 MG tablet Take 5 mg by mouth daily.     Calcium-Magnesium-Vitamin D (CITRACAL CALCIUM +D3) 600-40-500 MG-MG-UNIT TB24 Take by mouth.     Cholecalciferol (VITAMIN D-3 PO) Take 1 tablet by mouth in the morning.     cyanocobalamin 1000 MCG tablet Take 1,000 mcg by mouth daily.     levothyroxine  (SYNTHROID ) 88 MCG tablet  Take 1 tablet by mouth daily.     Omega-3 Fatty Acids (FISH OIL CONCENTRATE PO) Take 700 mg by mouth in the morning.     telmisartan (MICARDIS) 40 MG tablet Take 1 tablet by mouth daily.     No current facility-administered medications on file prior to visit.    Allergies  Allergen Reactions   Oxycodone Hcl     Other reaction(s): hallucinatins and bad dreams, Other (See Comments)   Indomethacin Nausea Only   Keflex [Cephalexin] Nausea Only   Percocet [Oxycodone-Acetaminophen ] Nausea Only   Tramadol Nausea Only    Social History   Substance and Sexual Activity  Alcohol Use No    Social History   Tobacco Use  Smoking Status Never  Smokeless Tobacco Never    Review of Systems  Constitutional: Negative.   HENT: Negative.    Eyes: Negative.   Respiratory: Negative.    Cardiovascular: Negative.   Gastrointestinal: Negative.   Genitourinary: Negative.   Musculoskeletal: Negative.   Skin: Negative.   Neurological: Negative.   Endo/Heme/Allergies: Negative.   Psychiatric/Behavioral: Negative.      Objective   Vitals:   12/20/23 0901  BP: 132/75  Pulse: (!) 55  Resp: 16  Temp: 97.8 F (36.6 C)  SpO2: 96%    Physical Exam Vitals reviewed.  Constitutional:  Appearance: Normal appearance. She is normal weight. She is not ill-appearing.  HENT:     Head: Normocephalic and atraumatic.  Cardiovascular:     Rate and Rhythm: Normal rate and regular rhythm.     Heart sounds: Normal heart sounds. No murmur heard.    No friction rub. No gallop.  Pulmonary:     Effort: Pulmonary effort is normal. No respiratory distress.     Breath sounds: Normal breath sounds. No stridor. No wheezing, rhonchi or rales.  Abdominal:     General: Bowel sounds are normal. There is no distension.     Palpations: Abdomen is soft. There is no mass.     Tenderness: There is no abdominal tenderness. There is no guarding or rebound.     Hernia: No hernia is present.     Comments:  Enlarged left inguinal lymph node.  No hernias appreciated.  Genitourinary:    Comments: Inflamed internal and external hemorrhoid at the 5 o'clock position.  No frank blood noted.  Sphincter tone normal.  No thrombus present. Musculoskeletal:     Cervical back: Normal range of motion and neck supple.  Lymphadenopathy:     Cervical: No cervical adenopathy.  Skin:    General: Skin is warm and dry.  Neurological:     Mental Status: She is alert and oriented to person, place, and time.   No axillary or supraclavicular lymphadenopathy.  Assessment  Left inguinal lymphadenopathy Internal and external inflamed hemorrhoid Plan  Patient is scheduled for a left inguinal lymph node biopsy and hemorrhoidectomy on 01/02/2024.  The risks and benefits of the procedure including bleeding, infection, and the possibility of recurrence of the hemorrhoidal disease were fully explained to the patient, who gave informed consent.

## 2023-12-26 NOTE — Patient Instructions (Signed)
 Brandi Mitchell  12/26/2023     @PREFPERIOPPHARMACY @   Your procedure is scheduled on  01/02/2024.   Report to Brandi Mitchell at  [phone]  A.M.   Call this number if you have problems the morning of surgery:  [phone]  If you experience any cold or flu symptoms such as cough, fever, chills, shortness of breath, etc. between now and your scheduled surgery, please notify us  at the above number.   Remember:  Do not eat after midnight.   You may drink clear liquids until  0710 am on 01/02/2024.      Clear liquids allowed are:                    Water, Carbonated beverages (diabetics please choose diet or no sugar options), Black Coffee Only (No creamer, milk or cream, including half & half and powdered creamer), and Clear Sports drink (No red color; diabetics please choose diet or no sugar options)    Take these medicines the morning of surgery with A SIP OF WATER                                     amlodipine, levothyroxine .    Do not wear jewelry, make-up or nail polish, including gel polish,  artificial nails, or any other type of covering on natural nails (fingers and  toes).  Do not wear lotions, powders, or perfumes, or deodorant.  Do not shave 48 hours prior to surgery.  Men may shave face and neck.  Do not bring valuables to the hospital.  [facility] is not responsible for any belongings or valuables.  Contacts, dentures or bridgework may not be worn into surgery.  Leave your suitcase in the car.  After surgery it may be brought to your room.  For patients admitted to the hospital, discharge time will be determined by your treatment team.  Patients discharged the day of surgery will not be allowed to drive home and must have someone with them for 24 hours.    Special instructions:   DO NOT smoke tobacco or vape for 24 hours before your procedure.  Please read over the following fact sheets that you were given. Coughing and Deep Breathing, Surgical Site  Infection Prevention, Anesthesia Post-op Instructions, and Care and Recovery After Surgery      Surgical Procedures for Hemorrhoids Surgery can be used to treat hemorrhoids. Hemorrhoids are swollen veins in and around the rectum or the opening of the butt (anus). There are two types: Internal. These occur in the veins just inside the rectum. They may poke through to the outside. External. These occur in the veins outside the anus. They can be felt as a swelling or hard lump near the anus. Hemorrhoids can cause bleeding and pain. They can often be treated at home. If home treatments do not help, you may need to have surgery. Types of surgery include: Closed hemorrhoidectomy. Open hemorrhoidectomy. Stapled hemorrhoidectomy. Tell a health care provider about: Any allergies you have. All medicines you are taking, including vitamins, herbs, eye drops, creams, and over-the-counter medicines. Any problems you or family members have had with anesthesia. Any bleeding problems you have. Any surgeries you have had. Any medical conditions you have. Whether you are pregnant or may be pregnant. What are the risks? Your health care provider will talk with you about  risks. These may include: Pain and bleeding. You may also have a hematoma. This is a collection of blood in the area where surgery was done. Constipation. You may also have fecal impaction. This is when poop gets trapped in the anal canal. Trouble peeing (urinating) or the inability to control when you pee or poop (incontinence). Stenosis. This is narrowing of the anal canal. Getting hemorrhoids again. A new passage (fistula) forming between the anus or rectum and somewhere else in the body. Rectal prolapse. This happens when the lining of the rectum slips out of the anus. What happens before the surgery? When to stop eating and drinking Follow instructions from your provider about what you may eat and drink. These may include: 8 hours  before your surgery Stop eating most foods. Do not eat meat, fried foods, or fatty foods. Eat only light foods, such as toast or crackers. All liquids are okay except energy drinks and alcohol. 6 hours before your surgery Stop eating. Drink only clear liquids, such as water, clear fruit juice, black coffee, plain tea, and sports drinks. Do not drink energy drinks or alcohol. 2 hours before your surgery Stop drinking all liquids. You may be allowed to take medicines with small sips of water. If you do not follow your provider's instructions, your surgery may be delayed or canceled. Medicines Ask your provider about: Changing or stopping your regular medicines. These include any diabetes medicines or blood thinners you take. Taking medicines such as aspirin  and ibuprofen. These medicines can thin your blood. Do not take them unless your provider tells you to. Taking over-the-counter medicines, vitamins, herbs, and supplements. Surgery safety Ask your provider: What steps will be taken to help prevent infection. These steps may include: Washing skin with a soap that kills germs. Taking antibiotics. General instructions You may be told to take a medicine that helps you poop (laxative) and an enema. This is done to clean out your colon before surgery (bowel prep). If you will be going home right after the procedure, plan to have a responsible adult: Take you home from the hospital or clinic. You will not be allowed to drive. Care for you for the time you are told. What happens during the surgery? An IV will be inserted into one of your veins. You will be given: A sedative. This helps you relax. Anesthesia. This keeps you from feeling pain. It will make you fall asleep for surgery. A jelly may be put into your rectum. A short tube (anoscope) will be put into your rectum to look at the hemorrhoids. If you have an internal hemorrhoid, you may need a closed hemorrhoidectomy. For this  surgery: Tools will be used to open the tissue around the hemorrhoids. The blood supply to the hemorrhoids will be tied off. The hemorrhoids will be taken out. The tissue around the affected area will be closed with stitches (sutures) that your body can absorb. If you have an open hemorrhoidectomy: Tools will be used to remove the hemorrhoids. The incisions will be left open to heal without sutures. If you have hemorrhoids that stick out of your anus, you may need a stapled hemorrhoidectomy. For this surgery: A stapling device will be used to partly remove the hemorrhoids. The device will be put into your anus. It will take out a ring of tissue. This tissue will come from the hemorrhoids and from right above the hemorrhoids. The staples in the device will close the edges of the tissue. This will cut off  the blood supply to any bits of the hemorrhoids that are left. It will also pull the tissue back into place. The surgeries may vary among providers and hospitals. What happens after the surgery? Your blood pressure, heart rate, breathing rate, and blood oxygen level may be monitored until you leave the hospital or clinic. You will be given pain medicine as needed. This information is not intended to replace advice given to you by your health care provider. Make sure you discuss any questions you have with your health care provider. Document Revised: 09/30/2021 Document Reviewed: 09/30/2021 Elsevier Patient Education  2024 [employer].Surgery to Biopsy a Lymph Node: What to Know After After surgery to biopsy a lymph node, it's common to have soreness, mild swelling, and bruising. Follow these instructions at home: Medicines Take your medicines only as told. Take your antibiotics as told. Do not stop taking them even if you start to feel better. You may need to take steps to help treat or prevent trouble pooping (constipation), such as: Taking medicines to help you poop. Eating foods high in  fiber, like beans, whole grains, and fresh fruits and vegetables. Drinking more fluids as told. Ask your health care provider if it's safe to drive or use machines while taking your medicine. Incision care  Take care of your cut as told. Make sure you: Wash your hands with soap and water for at least 20 seconds before and after you change your bandage. If you can't use soap and water, use hand sanitizer. Change your bandage. Leave stitches or skin glue alone. Leave tape strips alone unless you're told to take them off. You may trim the edges of the tape strips if they curl up. Check the area around your cut every day for signs of infection. Check for: More redness, swelling, or pain. Fluid or blood. Warmth. Pus or a bad smell. Bathing If your provider says you can take baths or showers, cover the bandage with a watertight covering to protect it from water. Do not let the bandage get wet. Keep the bandage dry until your provider says it can be taken off. Activity Ask if it's OK for you to lift. Ask what things are safe for you to do at home. Ask when you can go back to work or school. General instructions Do not smoke, vape, or use nicotine or tobacco. If the procedure was done under your arm, don't have blood pressure measurements or blood draws from that arm until your provider says it's OK. Get screened for extra fluid around your lymph nodes (lymphedema) as often as told. Keep all follow-up visits. Your provider will check for changes or problems that could affect healing. Your provider may give you more instructions. Make sure you know what you can and can't do. Contact a health care provider if: You have any signs of infection around your biopsy site. You have new bruising. You have pain or numbness that gets worse or lasts longer than a few days. Get help right away if: You have fever or chills. You have very bad pain that doesn't get better with medicine. This information is  not intended to replace advice given to you by your health care provider. Make sure you discuss any questions you have with your health care provider. Document Revised: 07/31/2022 Document Reviewed: 07/31/2022 Elsevier Patient Education  2024 Elsevier Inc.General Anesthesia, Adult, Care After The following information offers guidance on how to care for yourself after your procedure. Your health care provider may also  give you more specific instructions. If you have problems or questions, contact your health care provider. What can I expect after the procedure? After the procedure, it is common for people to: Have pain or discomfort at the IV site. Have nausea or vomiting. Have a sore throat or hoarseness. Have trouble concentrating. Feel cold or chills. Feel weak, sleepy, or tired (fatigue). Have soreness and body aches. These can affect parts of the body that were not involved in surgery. Follow these instructions at home: For the time period you were told by your health care provider:  Rest. Do not participate in activities where you could fall or become injured. Do not drive or use machinery. Do not drink alcohol. Do not take sleeping pills or medicines that cause drowsiness. Do not make important decisions or sign legal documents. Do not take care of children on your own. General instructions Drink enough fluid to keep your urine pale yellow. If you have sleep apnea, surgery and certain medicines can increase your risk for breathing problems. Follow instructions from your health care provider about wearing your sleep device: Anytime you are sleeping, including during daytime naps. While taking prescription pain medicines, sleeping medicines, or medicines that make you drowsy. Return to your normal activities as told by your health care provider. Ask your health care provider what activities are safe for you. Take over-the-counter and prescription medicines only as told by your  health care provider. Do not use any products that contain nicotine or tobacco. These products include cigarettes, chewing tobacco, and vaping devices, such as e-cigarettes. These can delay incision healing after surgery. If you need help quitting, ask your health care provider. Contact a health care provider if: You have nausea or vomiting that does not get better with medicine. You vomit every time you eat or drink. You have pain that does not get better with medicine. You cannot urinate or have bloody urine. You develop a skin rash. You have a fever. Get help right away if: You have trouble breathing. You have chest pain. You vomit blood. These symptoms may be an emergency. Get help right away. Call 911. Do not wait to see if the symptoms will go away. Do not drive yourself to the hospital. Summary After the procedure, it is common to have a sore throat, hoarseness, nausea, vomiting, or to feel weak, sleepy, or fatigue. For the time period you were told by your health care provider, do not drive or use machinery. Get help right away if you have difficulty breathing, have chest pain, or vomit blood. These symptoms may be an emergency. This information is not intended to replace advice given to you by your health care provider. Make sure you discuss any questions you have with your health care provider. Document Revised: 05/06/2021 Document Reviewed: 05/06/2021 Elsevier Patient Education  2024 Elsevier Inc.How to Use Chlorhexidine  at Home in the Shower Chlorhexidine  gluconate (CHG) is a germ-killing (antiseptic) wash that's used to clean the skin. It can get rid of the germs that normally live on the skin and can keep them away for about 24 hours. If you're having surgery, you may be told to shower with CHG at home the night before surgery. This can help lower your risk for infection. To use CHG wash in the shower, follow the steps below. Supplies needed: CHG body wash. Clean  washcloth. Clean towel. How to use CHG in the shower Follow these steps unless you're told to use CHG in a different way: Start the  shower. Use your normal soap and shampoo to wash your face and hair. Turn off the shower or move out of the shower stream. Pour CHG onto a clean washcloth. Do not use any type of brush or rough sponge. Start at your neck, washing your body down to your toes. Make sure you: Wash the part of your body where the surgery will be done for at least 1 minute. Do not scrub. Do not use CHG on your head or face unless your health care provider tells you to. If it gets into your ears or eyes, rinse them well with water. Do not wash your genitals with CHG. Wash your back and under your arms. Make sure to wash skin folds. Let the CHG sit on your skin for 1-2 minutes or as long as told. Rinse your entire body in the shower, including all body creases and folds. Turn off the shower. Dry off with a clean towel. Do not put anything on your skin afterward, such as powder, lotion, or perfume. Put on clean clothes or pajamas. If it's the night before surgery, sleep in clean sheets. General tips Use CHG only as told, and follow the instructions on the label. Use the full amount of CHG as told. This is often one bottle. Do not smoke and stay away from flames after using CHG. Your skin may feel sticky after using CHG. This is normal. The sticky feeling will go away as the CHG dries. Do not use CHG: If you have a chlorhexidine  allergy or have reacted to chlorhexidine  in the past. On open wounds or areas of skin that have broken skin, cuts, or scrapes. On babies younger than 41 months of age. Contact a health care provider if: You have questions about using CHG. Your skin gets irritated or itchy. You have a rash after using CHG. You swallow any CHG. Call your local poison control center [phone] in the [place] Your eyes itch badly, or they become very red or swollen. Your  hearing changes. You have trouble seeing. If you can't reach your provider, go to an urgent care or emergency room. Do not drive yourself. Get help right away if: You have swelling or tingling in your mouth or throat. You make high-pitched whistling sounds when you breathe, most often when you breathe out (wheeze). You have trouble breathing. These symptoms may be an emergency. Call 911 right away. Do not wait to see if the symptoms will go away. Do not drive yourself to the hospital. This information is not intended to replace advice given to you by your health care provider. Make sure you discuss any questions you have with your health care provider. Document Revised: 08/22/2022 Document Reviewed: 08/18/2021 Elsevier Patient Education  2024 [employer].

## 2023-12-28 ENCOUNTER — Encounter (HOSPITAL_COMMUNITY): Payer: Self-pay

## 2023-12-28 ENCOUNTER — Ambulatory Visit (HOSPITAL_COMMUNITY)
Admission: RE | Admit: 2023-12-28 | Discharge: 2023-12-28 | Disposition: A | Discharge status: Home / Self Care | Source: Ambulatory Visit | Attending: General Surgery | Admitting: General Surgery

## 2023-12-28 ENCOUNTER — Other Ambulatory Visit: Payer: Self-pay

## 2023-12-28 ENCOUNTER — Encounter (HOSPITAL_COMMUNITY)
Admission: RE | Admit: 2023-12-28 | Discharge: 2023-12-28 | Disposition: A | Discharge status: Home / Self Care | Source: Ambulatory Visit | Attending: General Surgery | Admitting: General Surgery

## 2023-12-28 ENCOUNTER — Other Ambulatory Visit (HOSPITAL_COMMUNITY)

## 2023-12-28 VITALS — BP 133/64 | HR 56 | Temp 97.8°F | Resp 16 | Ht 61.0 in | Wt 128.0 lb

## 2023-12-28 DIAGNOSIS — Z01818 Encounter for other preprocedural examination: Secondary | ICD-10-CM | POA: Diagnosis present

## 2023-12-28 DIAGNOSIS — I10 Essential (primary) hypertension: Secondary | ICD-10-CM | POA: Diagnosis present

## 2023-12-28 LAB — CBC WITH DIFFERENTIAL/PLATELET
Abs Immature Granulocytes: 0.01 K/uL (ref 0.00–0.07)
Basophils Absolute: 0 K/uL (ref 0.0–0.1)
Basophils Relative: 1 %
Eosinophils Absolute: 0.1 K/uL (ref 0.0–0.5)
Eosinophils Relative: 1 %
HCT: 39.2 % (ref 36.0–46.0)
Hemoglobin: 12.8 g/dL (ref 12.0–15.0)
Immature Granulocytes: 0 %
Lymphocytes Relative: 27 %
Lymphs Abs: 1.5 K/uL (ref 0.7–4.0)
MCH: 28.3 pg (ref 26.0–34.0)
MCHC: 32.7 g/dL (ref 30.0–36.0)
MCV: 86.5 fL (ref 80.0–100.0)
Monocytes Absolute: 0.6 K/uL (ref 0.1–1.0)
Monocytes Relative: 10 %
Neutro Abs: 3.2 K/uL (ref 1.7–7.7)
Neutrophils Relative %: 61 %
Platelets: 302 K/uL (ref 150–400)
RBC: 4.53 MIL/uL (ref 3.87–5.11)
RDW: 13.2 % (ref 11.5–15.5)
WBC: 5.3 K/uL (ref 4.0–10.5)
nRBC: 0 % (ref 0.0–0.2)

## 2023-12-28 LAB — BASIC METABOLIC PANEL WITH GFR
Anion gap: 10 (ref 5–15)
BUN: 19 mg/dL (ref 8–23)
CO2: 27 mmol/L (ref 22–32)
Calcium: 8.8 mg/dL — ABNORMAL LOW (ref 8.9–10.3)
Chloride: 103 mmol/L (ref 98–111)
Creatinine, Ser: 0.67 mg/dL (ref 0.44–1.00)
GFR, Estimated: >60 mL/min (ref >60)
Glucose, Bld: 102 mg/dL — ABNORMAL HIGH (ref 70–99)
Potassium: 3.9 mmol/L (ref 3.5–5.1)
Sodium: 140 mmol/L (ref 135–145)

## 2024-01-02 ENCOUNTER — Encounter (HOSPITAL_COMMUNITY)
Admission: RE | Disposition: A | Discharge status: Home / Self Care | Payer: Self-pay | Source: Home / Self Care | Attending: General Surgery

## 2024-01-02 ENCOUNTER — Ambulatory Visit (HOSPITAL_COMMUNITY)
Admission: RE | Admit: 2024-01-02 | Discharge: 2024-01-02 | Disposition: A | Discharge status: Home / Self Care | Attending: General Surgery | Admitting: General Surgery

## 2024-01-02 ENCOUNTER — Ambulatory Visit (HOSPITAL_COMMUNITY): Admitting: Anesthesiology

## 2024-01-02 DIAGNOSIS — C774 Secondary and unspecified malignant neoplasm of inguinal and lower limb lymph nodes: Secondary | ICD-10-CM | POA: Insufficient documentation

## 2024-01-02 DIAGNOSIS — K648 Other hemorrhoids: Secondary | ICD-10-CM

## 2024-01-02 DIAGNOSIS — R599 Enlarged lymph nodes, unspecified: Secondary | ICD-10-CM | POA: Diagnosis not present

## 2024-01-02 DIAGNOSIS — R590 Localized enlarged lymph nodes: Secondary | ICD-10-CM | POA: Diagnosis present

## 2024-01-02 DIAGNOSIS — K649 Unspecified hemorrhoids: Secondary | ICD-10-CM | POA: Diagnosis not present

## 2024-01-02 DIAGNOSIS — C211 Malignant neoplasm of anal canal: Secondary | ICD-10-CM | POA: Diagnosis not present

## 2024-01-02 DIAGNOSIS — E039 Hypothyroidism, unspecified: Secondary | ICD-10-CM | POA: Diagnosis not present

## 2024-01-02 DIAGNOSIS — R011 Cardiac murmur, unspecified: Secondary | ICD-10-CM | POA: Insufficient documentation

## 2024-01-02 DIAGNOSIS — G709 Myoneural disorder, unspecified: Secondary | ICD-10-CM | POA: Diagnosis not present

## 2024-01-02 DIAGNOSIS — I10 Essential (primary) hypertension: Secondary | ICD-10-CM | POA: Diagnosis not present

## 2024-01-02 DIAGNOSIS — K644 Residual hemorrhoidal skin tags: Secondary | ICD-10-CM | POA: Insufficient documentation

## 2024-01-02 DIAGNOSIS — Z01818 Encounter for other preprocedural examination: Secondary | ICD-10-CM

## 2024-01-02 HISTORY — PX: INGUINAL LYMPH NODE BIOPSY: SHX5865

## 2024-01-02 HISTORY — PX: HEMORRHOID SURGERY: SHX153

## 2024-01-02 SURGERY — BIOPSY, LYMPH NODE, INGUINAL, OPEN
Anesthesia: General | Site: Rectum

## 2024-01-02 MED ORDER — CHLORHEXIDINE GLUCONATE 0.12 % MT SOLN: 15.0000 mL | Freq: Once | OROMUCOSAL | Status: DC

## 2024-01-02 MED ORDER — LACTATED RINGERS IV SOLN
INTRAVENOUS | Status: DC | PRN
Start: 2024-01-02 — End: 2024-01-02

## 2024-01-02 MED ORDER — PROPOFOL 10 MG/ML IV BOLUS
INTRAVENOUS | Status: DC | PRN
  Administered 2024-01-02: 150 mg via INTRAVENOUS

## 2024-01-02 MED ORDER — SODIUM CHLORIDE 0.9 % IR SOLN
Status: DC | PRN
  Administered 2024-01-02: 1

## 2024-01-02 MED ORDER — LACTATED RINGERS IV SOLN: INTRAVENOUS | Status: DC

## 2024-01-02 MED ORDER — ONDANSETRON 4 MG PO TBDP: 4.0000 mg | ORAL_TABLET | Freq: Three times a day (TID) | ORAL | 0 refills | Status: DC | PRN

## 2024-01-02 MED ORDER — CHLORHEXIDINE GLUCONATE CLOTH 2 % EX PADS: 6.0000 | MEDICATED_PAD | Freq: Once | CUTANEOUS | Status: DC

## 2024-01-02 MED ORDER — ONDANSETRON HCL 4 MG/2ML IJ SOLN: 4.0000 mg | Freq: Once | INTRAMUSCULAR | Status: DC | PRN

## 2024-01-02 MED ORDER — LIDOCAINE VISCOUS HCL 2 % MT SOLN
OROMUCOSAL | Status: AC
  Filled 2024-01-02: qty 15

## 2024-01-02 MED ORDER — KETOROLAC TROMETHAMINE 30 MG/ML IJ SOLN
30.0000 mg | Freq: Once | INTRAMUSCULAR | Status: AC
  Administered 2024-01-02: 30 mg via INTRAVENOUS
  Filled 2024-01-02: qty 1

## 2024-01-02 MED ORDER — DEXAMETHASONE SOD PHOSPHATE PF 10 MG/ML IJ SOLN
INTRAMUSCULAR | Status: DC | PRN
  Administered 2024-01-02: 4 mg via INTRAVENOUS

## 2024-01-02 MED ORDER — FENTANYL CITRATE (PF) 100 MCG/2ML IJ SOLN
INTRAMUSCULAR | Status: DC | PRN
  Administered 2024-01-02 (×2): 50 ug via INTRAVENOUS

## 2024-01-02 MED ORDER — MIDAZOLAM HCL (PF) 2 MG/2ML IJ SOLN
INTRAMUSCULAR | Status: DC | PRN
  Administered 2024-01-02: 2 mg via INTRAVENOUS

## 2024-01-02 MED ORDER — LIDOCAINE VISCOUS HCL 2 % MT SOLN
OROMUCOSAL | Status: DC | PRN
Start: 2024-01-02 — End: 2024-01-02
  Administered 2024-01-02: 1 via OROMUCOSAL

## 2024-01-02 MED ORDER — CHLORHEXIDINE GLUCONATE 0.12 % MT SOLN
OROMUCOSAL | Status: AC
  Filled 2024-01-02: qty 15

## 2024-01-02 MED ORDER — FENTANYL CITRATE (PF) 100 MCG/2ML IJ SOLN
INTRAMUSCULAR | Status: AC
  Filled 2024-01-02: qty 2

## 2024-01-02 MED ORDER — FENTANYL CITRATE (PF) 50 MCG/ML IJ SOSY: 25.0000 ug | PREFILLED_SYRINGE | INTRAMUSCULAR | Status: DC | PRN

## 2024-01-02 MED ORDER — BUPIVACAINE HCL (PF) 0.5 % IJ SOLN
INTRAMUSCULAR | Status: AC
  Filled 2024-01-02: qty 30

## 2024-01-02 MED ORDER — SURGILUBE EX GEL
CUTANEOUS | Status: DC | PRN
  Administered 2024-01-02: 1 via TOPICAL

## 2024-01-02 MED ORDER — METRONIDAZOLE 500 MG/100ML IV SOLN
INTRAVENOUS | Status: AC
  Filled 2024-01-02: qty 100

## 2024-01-02 MED ORDER — BUPIVACAINE HCL (PF) 0.5 % IJ SOLN
INTRAMUSCULAR | Status: DC | PRN
  Administered 2024-01-02: 30 mL

## 2024-01-02 MED ORDER — ONDANSETRON HCL 4 MG/2ML IJ SOLN
INTRAMUSCULAR | Status: DC | PRN
Start: 2024-01-02 — End: 2024-01-02
  Administered 2024-01-02: 4 mg via INTRAVENOUS

## 2024-01-02 MED ORDER — CIPROFLOXACIN IN D5W 400 MG/200ML IV SOLN
INTRAVENOUS | Status: AC
  Filled 2024-01-02: qty 200

## 2024-01-02 MED ORDER — PROPOFOL 10 MG/ML IV BOLUS
INTRAVENOUS | Status: AC
  Filled 2024-01-02: qty 20

## 2024-01-02 MED ORDER — CIPROFLOXACIN IN D5W 400 MG/200ML IV SOLN
400.0000 mg | INTRAVENOUS | Status: AC
  Administered 2024-01-02: 400 mg via INTRAVENOUS

## 2024-01-02 MED ORDER — ORAL CARE MOUTH RINSE: 15.0000 mL | Freq: Once | OROMUCOSAL | Status: DC

## 2024-01-02 MED ORDER — MIDAZOLAM HCL 2 MG/2ML IJ SOLN
INTRAMUSCULAR | Status: AC
  Filled 2024-01-02: qty 2

## 2024-01-02 MED ORDER — METRONIDAZOLE 500 MG/100ML IV SOLN
500.0000 mg | INTRAVENOUS | Status: AC
  Administered 2024-01-02: 500 mg via INTRAVENOUS

## 2024-01-02 MED ORDER — HYDROCODONE-ACETAMINOPHEN 5-325 MG PO TABS: 1.0000 | ORAL_TABLET | ORAL | 0 refills | Status: DC | PRN

## 2024-01-02 SURGICAL SUPPLY — 37 items
CLOTH BEACON ORANGE TIMEOUT ST (SAFETY) ×2 IMPLANT
CNTNR URN SCR LID CUP LEK RST (MISCELLANEOUS) ×2 IMPLANT
COVER LIGHT HANDLE STERIS (MISCELLANEOUS) ×4 IMPLANT
DERMABOND ADVANCED .7 DNX12 (GAUZE/BANDAGES/DRESSINGS) ×2 IMPLANT
DISSECTOR SURG LIGASURE 21 (MISCELLANEOUS) ×2 IMPLANT
DRAPE HALF SHEET 40X57 (DRAPES) ×2 IMPLANT
ELECTRODE REM PT RTRN 9FT ADLT (ELECTROSURGICAL) ×2 IMPLANT
GAUZE 4X4 16PLY ~~LOC~~+RFID DBL (SPONGE) ×2 IMPLANT
GAUZE SPONGE 4X4 12PLY STRL (GAUZE/BANDAGES/DRESSINGS) ×4 IMPLANT
GLOVE BIOGEL PI IND STRL 7.0 (GLOVE) ×4 IMPLANT
GLOVE SURG SS PI 7.5 STRL IVOR (GLOVE) ×4 IMPLANT
GOWN STRL REUS W/TWL LRG LVL3 (GOWN DISPOSABLE) ×4 IMPLANT
HEMOSTAT SURGICEL 4X8 (HEMOSTASIS) ×2 IMPLANT
KIT TURNOVER CYSTO (KITS) ×2 IMPLANT
KIT TURNOVER KIT A (KITS) ×2 IMPLANT
MANIFOLD NEPTUNE II (INSTRUMENTS) ×2 IMPLANT
NDL HYPO 18GX1.5 BLUNT FILL (NEEDLE) ×2 IMPLANT
NDL HYPO 21X1.5 SAFETY (NEEDLE) ×2 IMPLANT
NDL HYPO 25X1 1.5 SAFETY (NEEDLE) ×2 IMPLANT
NEEDLE HYPO 18GX1.5 BLUNT FILL (NEEDLE) ×2 IMPLANT
NEEDLE HYPO 21X1.5 SAFETY (NEEDLE) ×2 IMPLANT
NEEDLE HYPO 25X1 1.5 SAFETY (NEEDLE) ×2 IMPLANT
PACK MINOR (CUSTOM PROCEDURE TRAY) ×2 IMPLANT
PACK PERI GYN (CUSTOM PROCEDURE TRAY) ×2 IMPLANT
PAD ARMBOARD POSITIONER FOAM (MISCELLANEOUS) ×2 IMPLANT
PENCIL SMOKE EVACUATOR (MISCELLANEOUS) ×2 IMPLANT
POSITIONER HEAD 8X9X4 ADT (SOFTGOODS) ×2 IMPLANT
SET BASIN LINEN APH (SET/KITS/TRAYS/PACK) ×2 IMPLANT
SHEARS HARMONIC 9CM CVD (BLADE) IMPLANT
SOLN 0.9% NACL POUR BTL 1000ML (IV SOLUTION) ×2 IMPLANT
SOLUTION SCRB POV-IOD 4OZ 7.5% (MISCELLANEOUS) ×2 IMPLANT
SURGILUBE 2OZ TUBE FLIPTOP (MISCELLANEOUS) ×2 IMPLANT
SUT MNCRL AB 4-0 PS2 18 (SUTURE) ×2 IMPLANT
SUT SILK 0 FSL (SUTURE) ×2 IMPLANT
SUT VIC AB 3-0 SH 27X BRD (SUTURE) ×2 IMPLANT
SYR 30ML LL (SYRINGE) ×4 IMPLANT
SYR CONTROL 10ML LL (SYRINGE) ×2 IMPLANT

## 2024-01-02 NOTE — Anesthesia Preprocedure Evaluation (Signed)
 Anesthesia Evaluation  Patient identified by MRN, date of birth, ID band Patient awake    Reviewed: Allergy & Precautions, H&P , NPO status , Patient's Chart, lab work & pertinent test results, reviewed documented beta blocker date and time   Airway Mallampati: II  TM Distance: >3 FB Neck ROM: full    Dental no notable dental hx.    Pulmonary neg pulmonary ROS   Pulmonary exam normal breath sounds clear to auscultation       Cardiovascular Exercise Tolerance: Good hypertension, + Valvular Problems/Murmurs  Rhythm:regular Rate:Normal     Neuro/Psych  Headaches PSYCHIATRIC DISORDERS       Neuromuscular disease    GI/Hepatic negative GI ROS, Neg liver ROS,,,  Endo/Other  Hypothyroidism    Renal/GU negative Renal ROS  negative genitourinary   Musculoskeletal   Abdominal   Peds  Hematology negative hematology ROS (+)   Anesthesia Other Findings   Reproductive/Obstetrics negative OB ROS                              Anesthesia Physical Anesthesia Plan  ASA: 2  Anesthesia Plan: General and General LMA   Post-op Pain Management:    Induction:   PONV Risk Score and Plan: Ondansetron   Airway Management Planned:   Additional Equipment:   Intra-op Plan:   Post-operative Plan:   Informed Consent: I have reviewed the patients History and Physical, chart, labs and discussed the procedure including the risks, benefits and alternatives for the proposed anesthesia with the patient or authorized representative who has indicated his/her understanding and acceptance.     Dental Advisory Given  Plan Discussed with: CRNA  Anesthesia Plan Comments:         Anesthesia Quick Evaluation

## 2024-01-02 NOTE — Transfer of Care (Signed)
 Immediate Anesthesia Transfer of Care Note  Patient: Brandi Mitchell  Procedure(s) Performed: BIOPSY, LYMPH NODE, INGUINAL, OPEN (Left: Groin) HEMORRHOIDECTOMY (Rectum)  Patient Location: PACU  Anesthesia Type:General  Level of Consciousness: drowsy and patient cooperative  Airway & Oxygen Therapy: Patient Spontanous Breathing and Patient connected to face mask oxygen  Post-op Assessment: Report given to RN, Post -op Vital signs reviewed and stable, and Patient moving all extremities X 4  Post vital signs: Reviewed and stable  Last Vitals:  Vitals Value Taken Time  BP 97/63 01/02/24 12:07  Temp 98 1210  Pulse 50 01/02/24 12:10  Resp 8 01/02/24 12:10  SpO2 100 % 01/02/24 12:10  Vitals shown include unfiled device data.  Last Pain:  Vitals:   01/02/24 0928  TempSrc: Oral  PainSc: 0-No pain      Patients Stated Pain Goal: 9 (01/02/24 9071)  Complications: No notable events documented.

## 2024-01-02 NOTE — Anesthesia Procedure Notes (Signed)
 Procedure Name: LMA Insertion Date/Time: 01/02/2024 11:12 AM  Performed by: Cordella Elvie HERO, CRNAPre-anesthesia Checklist: Patient identified, Emergency Drugs available, Suction available, Patient being monitored and Timeout performed Patient Re-evaluated:Patient Re-evaluated prior to induction Oxygen Delivery Method: Circle system utilized Preoxygenation: Pre-oxygenation with 100% oxygen Induction Type: IV induction LMA: LMA inserted LMA Size: 4.0 Number of attempts: 1 Placement Confirmation: positive ETCO2, CO2 detector and breath sounds checked- equal and bilateral Tube secured with: Tape Dental Injury: Teeth and Oropharynx as per pre-operative assessment

## 2024-01-02 NOTE — Interval H&P Note (Signed)
 History and Physical Interval Note:  01/02/2024 10:25 AM  Brandi Mitchell  has presented today for surgery, with the diagnosis of LYMPHADENOPATHY, INGUINAL, LEFT  HEMORRHOID, EXTERNAL.  The various methods of treatment have been discussed with the patient and family. After consideration of risks, benefits and other options for treatment, the patient has consented to  Procedure(s): BIOPSY, LYMPH NODE, INGUINAL, OPEN (Left) HEMORRHOIDECTOMY (N/A) as a surgical intervention.  The patient's history has been reviewed, patient examined, no change in status, stable for surgery.  I have reviewed the patient's chart and labs.  Questions were answered to the patient's satisfaction.     Brandi Mitchell

## 2024-01-02 NOTE — Op Note (Signed)
 Patient:  Brandi Mitchell  DOB:  May 05, 1960  MRN:  [id number]   Preop Diagnosis: Left inguinal lymphadenopathy, bleeding hemorrhoids  Postop Diagnosis: Same  Procedure: Left inguinal lymph node excision, internal and external simple hemorrhoidectomy  Surgeon: Oneil Budge, MD  Anes: General  Indications: Patient is a 63 year old white female who presents with a persistent large left inguinal lymph node as well as bleeding hemorrhoids.  The risks and benefits of both procedures including bleeding, infection, and recurrence of the hemorrhoidal disease were fully explained to the patient, who gave informed consent.  Procedure note: The patient was placed in the supine position.  After induction of general endotracheal anesthesia, the groin and perineum were prepped and draped using usual sterile technique with Betadine.  Surgical site confirmation was performed.  An incision was made in the left groin region over the mass.  The lymph node was greater than 3 cm in size.  A LigaSure was used to excise it.  It was sent to pathology for flow cytometry.  A bleeding was controlled using Bovie electrocautery.  The subcutaneous layer was reapproximated using a 3-0 Vicryl interrupted suture.  The skin was closed using a 4-0 Monocryl subcuticular suture.  Dermabond was applied.  Next, the patient was placed in the lithotomy position.  On rectal examination, a bleeding internal and external hemorrhoid was noted at the 5 o'clock position.  It was excised in a column like fashion using the LigaSure.  The specimen was sent to pathology further examination.  0.5% Sensorcaine  was instilled into the surrounding perineum.  Care was taken to avoid the external sphincter mechanism.  Surgicel and viscous Xylocaine  rectal packing was then placed.  All tape and needle counts were correct at the end of the procedures.  The patient was awakened and transferred to PACU in stable condition.  Complications: None  EBL:  Minimal  Specimen: Left inguinal lymph node, hemorrhoids

## 2024-01-03 ENCOUNTER — Encounter (HOSPITAL_COMMUNITY): Payer: Self-pay

## 2024-01-03 ENCOUNTER — Encounter (HOSPITAL_COMMUNITY): Payer: Self-pay | Admitting: General Surgery

## 2024-01-03 ENCOUNTER — Ambulatory Visit (HOSPITAL_COMMUNITY)

## 2024-01-04 NOTE — Anesthesia Postprocedure Evaluation (Signed)
 Anesthesia Post Note  Patient: Beula Shona Sar  Procedure(s) Performed: BIOPSY, LYMPH NODE, INGUINAL, OPEN (Left: Groin) HEMORRHOIDECTOMY (Rectum)  Patient location during evaluation: Phase II Anesthesia Type: General Level of consciousness: awake Pain management: pain level controlled Vital Signs Assessment: post-procedure vital signs reviewed and stable Respiratory status: spontaneous breathing and respiratory function stable Cardiovascular status: blood pressure returned to baseline and stable Postop Assessment: no headache and no apparent nausea or vomiting Anesthetic complications: no Comments: Late entry   No notable events documented.   Last Vitals:  Vitals:   01/02/24 1245 01/02/24 1315  BP: 125/69 137/83  Pulse: (!) 51 (!) 59  Resp: 14 17  Temp:  36.7 C  SpO2: 99% 100%    Last Pain:  Vitals:   01/02/24 1315  TempSrc: Oral  PainSc: 0-No pain                 Yvonna JINNY Bosworth

## 2024-01-07 LAB — SURGICAL PATHOLOGY

## 2024-01-08 ENCOUNTER — Ambulatory Visit: Admitting: General Surgery

## 2024-01-08 ENCOUNTER — Encounter: Payer: Self-pay | Admitting: General Surgery

## 2024-01-08 VITALS — BP 128/78 | HR 64 | Temp 97.8°F | Resp 12 | Ht 61.0 in | Wt 125.0 lb

## 2024-01-08 DIAGNOSIS — C210 Malignant neoplasm of anus, unspecified: Secondary | ICD-10-CM

## 2024-01-08 DIAGNOSIS — Z09 Encounter for follow-up examination after completed treatment for conditions other than malignant neoplasm: Secondary | ICD-10-CM

## 2024-01-09 NOTE — Progress Notes (Signed)
 Subjective:     Brandi Shona Mitchell  Patient here for postoperative visit, status post left inguinal lymph node excision with hemorrhoidectomy.  Patient is doing well.  Minimal blood per rectum noted. Objective:    BP 128/78   Pulse 64   Temp 97.8 F (36.6 C) (Oral)   Resp 12   Ht 5' 1 (1.549 m)   Wt 125 lb (56.7 kg)   SpO2 95%   BMI 23.62 kg/m   General:  alert, cooperative, and no distress  Left groin incision healing well.  No hematoma or seroma present. Rectal examination reveals a healing surgical site.   SURGICAL PATHOLOGY Surgical pathology Collected: 01/02/24 1131  Lab status: Final-Edited  Resulting lab: [facility]  Value: SURGICAL PATHOLOGY CASE: [id number] PATIENT: [facility] Surgical Pathology Report     Clinical History: Lymphadenopathy, inguinal, left hemorrhoid, external (las)     FINAL MICROSCOPIC DIAGNOSIS:  A. HEMORRHOID, HEMORRHOIDECTOMY: -  Invasive moderately differentiated squamous cell carcinoma (depth of invasion 5 mm -  An unoriented lateral margin is positive.   The deep margin negative but is less than 1 mm.  Note: Dr. Rebbecca has peer reviewed the case and agrees with the interpretation.  Initial results were discussed with Dr. Mavis on 01/03/2024  B. LYMPH NODE, LEFT INGUINAL, BIOPSY: -  Metastatic moderately differentiated squamous cell carcinoma.  Note: It is noted that the specimen is submitted as a lymph node; however, there is essentially no residual lymph node present in the entire specimen is replaced by moderately differentiated squamous cell carcinoma.   GROSS DESCRIPTION:  A.  Received in formalin is a 2.0 x 1.0 x 0.9 cm fragment of tan-pink soft tissue. The specimen is serially sectioned revealing an underlying tan to hemorrhagic cut surface. The specimen is entirely submitted in 1 block. (KW, 01/02/2024)  B.  Received fresh is a 5.0 x 2.9 x 2.0 cm lymph node candidate. Sectioning  reveals an underlying white-tan cut surface.  Representative sections are submitted in 5 blocks. (KW, 01/03/2024)    Final Diagnosis performed by Ronnell Clinger LeGolvan DO.   Electronically signed 01/07/2024 Technical component performed at [facility], [address] [place], [place] [address].  Professional component performed at [facility], [address], [place], [place] [address].  Immunohistochemistry Technical component (if applicable) was performed at [facility]. [address], [place], [place] [address].   IMMUNOHISTOCHEMISTRY DISCLAIMER (if applicable): Some of these immunohistochemical stains may have been developed and the performance characteristics determine by [facility]. Some may not have been cleared or approved by the U.S. Food and Drug Administration. The FDA has determined that such clearance or approval is not necessary. This test is used for clinical purposes. It should not be regarded as investigational or for research. This laboratory is certified under the Clinical Laboratory Improvement Amendments of 1988 (CLIA-88) as qualified to perform high complexity clinical laboratory testing.  The controls stained appropriately.   IHC stains are performed on formalin fixed, paraffin embedded tissue using a 3,3diaminobenzidine (DAB) chromogen and Leica Bond Autostainer System. The staining intensity of the nucleus is score manually and is reported as the percentage of tumor cell nuclei demonstrating specific nuclear staining. The specimens are fixed in 10% Neutral Formalin for at least 6 hours and up to 72hrs. These tests are validated on decalcified tissue. Results should be interpreted with caution given the possibility of false negative results on decalcified specimens. Antibody Clones are as  follows ER-clone 25F, PR-clone 16, Ki67- clone MM1. Some of these immunohistochemical stains may  have been developed and the performance characteristics determined by [facility].   Results Surgical pathology (Order [id number]) MyChart Results Release  MyChart Status: Active  Results Release   Surgical pathology Order: [id number]  Status: Edited Result - FINAL     Next appt: 01/23/2024 at 01:40 PM in Oncology Felicita Dry, MD)   Test Result Released: Yes (seen)   0 Result Notes    Component Ref Range & Units (hover) 7 d ago  SURGICAL PATHOLOGY SURGICAL PATHOLOGY CASE: [id number] PATIENT: Brandi Mitchell Surgical Pathology Report     Clinical History: Lymphadenopathy, inguinal, left hemorrhoid, external (las)     FINAL MICROSCOPIC DIAGNOSIS:  A. HEMORRHOID, HEMORRHOIDECTOMY: -  Invasive moderately differentiated squamous cell carcinoma (depth of invasion 5 mm -  An unoriented lateral margin is positive.   The deep margin negative but is less than 1 mm.  Note: Dr. Rebbecca has peer reviewed the case and agrees with the interpretation.  Initial results were discussed with Dr. Mavis on 01/03/2024  B. LYMPH NODE, LEFT INGUINAL, BIOPSY: -  Metastatic moderately differentiated squamous cell carcinoma.  Note: It is noted that the specimen is submitted as a lymph node; however, there is essentially no residual lymph node present in the entire specimen is replaced by moderately differentiated squamous cell carcinoma.   GROSS DESCRIPTION:  A.  Received in formalin is a 2.0 x 1.0 x 0.9 cm fragment of tan-pink soft tissue. The specimen is serially sectioned revealing an underlying tan to hemorrhagic cut surface. The specimen is entirely submitted in 1 block. (KW, 01/02/2024)  B.  Received fresh is a 5.0 x 2.9 x 2.0 cm lymph node candidate. Sectioning reveals an underlying white-tan cut surface.  Representative sections are submitted in 5 blocks. (KW, 01/03/2024)    Final Diagnosis performed by Giavonni Cizek LeGolvan DO.   Electronically  signed 01/07/2024 Technical component performed at [facility], [address], [place], [place] [address].  Professional component performed at [facility], [address] [place], [place] [address].  Immunohistochemistry Technical component (if applicable) was performed at [facility]. [address], [place], [place] [address].   IMMUNOHISTOCHEMISTRY DISCLAIMER (if applicable): Some of these immunohistochemical stains may have been developed and the performance characteristics determine by [facility]. Some may not have been cleared or approved by the U.S. Food and Drug Administration. The FDA has determined that such clearance or approval is not necessary. This test is used for clinical purposes. It should not be regarded as investigational or for research. This laboratory is certified under the Clinical Laboratory Improvement Amendments of 1988 (CLIA-88) as qualified to perform high complexity clinical laboratory testing.  The controls stained appropriately.   IHC stains are performed on formalin fixed, paraffin embedded tissue using a 3,3diaminobenzidine (DAB) chromogen and Leica Bond Autostainer System. The staining intensity of the nucleus is score manually and is reported as the percentage of tumor cell nuclei demonstrating specific nuclear staining. The specimens are fixed in 10% Neutral Formalin for at least 6 hours and up to 72hrs. These tests are validated on decalcified tissue. Results should be interpreted with caution given the possibility of false negative results on decalcified specimens. Antibody Clones are as follows ER-clone 25F, PR-clone 16, Ki67- clone MM1. Some of these immunohistochemical stains may have been developed and the performance characteristics determined by [facility].  Resulting Agency [facility]  Specimen Collected: 01/02/24 11:31 Last Resulted:  01/07/24 12:28         Assessment:    Doing well postoperatively. 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  Patient has been made aware of the diagnosis.  Interestingly, her father has had a similar diagnosis.    Plan:   Will refer patient to Dr. Bernarda Ned of colorectal surgery in [place] given that the margin was not clear.  Will also refer patient to oncology.  Patient may need a Port-A-Cath and she is familiar with this due to family members having a port.  All questions answered.

## 2024-01-22 NOTE — Progress Notes (Signed)
 "  Hematology-Oncology Clinic Note  [facility]   Reason for Referral: Anal carcinoma  Oncology History: I have reviewed her chart and materials related to her cancer extensively and collaborated history with the patient. Summary of oncologic history is as follows:  Diagnosis: Anal carcinoma  -Presentation: Palpable left inguinal mass -11/05/2023: Left Groin Soft Tissue US : The palpable mass corresponds to a 4.0 x 2.1 x 4.1 cm lobular hypoechoic mass 4 mm deep to the skin, probably a pathologic lymph node given location, but is nonspecific. -11/24/2023: CT [facility]: Similar size of left inguinal lymph node given differences in technique consider further evaluation by direct tissue sampling with ultrasound guidance. -01/02/2024: Left inguinal lymph node biopsy with hemorrhoidectomy.  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 An unoriented lateral margin is positive. The deep margin negative but is less than 1 mm. Inguinal lymph node positive for metastatic moderately differentiated squamous cell carcinoma.  -Patient has been referred to Dr. Debby for not clear margins   History of Presenting Illness: Discussed the use of AI scribe software for clinical note transcription with the patient, who gave verbal consent to proceed.  History of Present Illness Brandi Mitchell is a 63 year old female with anal cancer who presents for consultation regarding treatment options. She is accompanied by her husband and mother today. She was referred by Dr. Mavis after a biopsy confirmed anal cancer.  She was diagnosed with anal cancer following a biopsy of a lymph node that was positive. The cancer was discovered after developing hemorrhoids, which led to further investigation. She is scheduled for a CT scan of her chest to rule out any disease spread.  There is a family history of anal cancer, with both her father and sister  having had the condition. Her father responded well to treatment, and her sister was treated at [facility] years ago.   She experiences occasional blood in her stools, particularly after the surgery, and episodes of constipation. She has not had a colonoscopy despite having hemorrhoids. Hemorrhoids have caused discomfort and bleeding, and she uses a donut cushion for relief when sitting.  She does not smoke and drinks alcohol occasionally, typically a glass of wine. She works as a child psychotherapist at [employer], assisting with exercise machines and maintaining an active lifestyle. She is interested in continuing her physical activities, including workouts with weights, during treatment.   She has no dietary restrictions and is encouraged to maintain a high-calorie diet.    Medical History: 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   Heart murmur    I have had it all my life but no issues with it   Hypothyroidism    Migraine headache    Thyroid  disease     Surgical history: Past Surgical History:  Procedure Laterality Date   ABDOMINAL HYSTERECTOMY     BREAST EXCISIONAL BIOPSY Left    CESAREAN SECTION     FOOT SURGERY     HEMORRHOID SURGERY N/A 01/02/2024   Procedure: HEMORRHOIDECTOMY;  Surgeon: Mavis Anes, MD;  Location: [facility];  Service: General;  Laterality: N/A;   INGUINAL LYMPH NODE BIOPSY Left 01/02/2024   Procedure: BIOPSY, LYMPH NODE, INGUINAL, OPEN;  Surgeon: Mavis Anes, MD;  Location: [facility];  Service: General;  Laterality: Left;   IR IMAGING GUIDED PORT INSERTION  02/15/2024   TMJ  ARTHROPLASTY     TONSILLECTOMY     TOTAL HIP ARTHROPLASTY Left 03/13/2017   Procedure: TOTAL HIP ARTHROPLASTY ANTERIOR APPROACH;  Surgeon: Sheril Coy, MD;  Location: [facility];  Service: Orthopedics;  Laterality: Left;   TOTAL HIP ARTHROPLASTY Right 10/30/2017   Procedure: TOTAL HIP ARTHROPLASTY ANTERIOR  APPROACH;  Surgeon: Sheril Coy, MD;  Location: [facility];  Service: Orthopedics;  Laterality: Right;   TRIGGER FINGER RELEASE Left      Allergies:  is allergic to levothyroxine  sodium, oxycodone hcl, indomethacin, keflex [cephalexin], percocet [oxycodone-acetaminophen ], and tramadol.  Medications:  Current Outpatient Medications  Medication Sig Dispense Refill   amLODipine (NORVASC) 5 MG tablet Take 5 mg by mouth daily.     Calcium-Magnesium-Vitamin D (CITRACAL CALCIUM +D3) 600-40-500 MG-MG-UNIT TB24 Take 1 tablet by mouth daily at 12 noon.     cyanocobalamin 1000 MCG tablet Take 1,000 mcg by mouth daily.     levothyroxine  (SYNTHROID ) 88 MCG tablet Take 1 tablet by mouth daily. Brand name Synthroid      Omega-3 Fatty Acids (FISH OIL CONCENTRATE) 1000 MG CAPS Take 1,000 mg by mouth in the morning.     telmisartan (MICARDIS) 40 MG tablet Take 1 tablet by mouth every evening.     FLUOROURACIL  IV Inject into the vein every 28 (twenty-eight) days.     lidocaine -prilocaine  (EMLA ) cream Apply to affected area once 30 g 3   MITOMYCIN  IV Inject into the vein every 28 (twenty-eight) days.     ondansetron  (ZOFRAN ) 8 MG tablet Take 1 tablet (8 mg total) by mouth every 8 (eight) hours as needed for nausea or vomiting. 30 tablet 1   prochlorperazine  (COMPAZINE ) 10 MG tablet Take 1 tablet (10 mg total) by mouth every 6 (six) hours as needed for nausea or vomiting. 30 tablet 1   No current facility-administered medications for this visit.     Physical Examination: ECOG PERFORMANCE STATUS: 0 - Asymptomatic  Vitals:   01/23/24 1357 01/23/24 1404  BP: (!) 162/147 131/66  Pulse: (!) 54   Resp: 17   Temp: 97.8 F (36.6 C)   SpO2: 100%    Filed Weights   01/23/24 1357  Weight: 122 lb (55.3 kg)    GENERAL:alert, no distress and comfortable LYMPH:  no palpable lymphadenopathy in the cervical, axillary or inguinal LUNGS: clear to auscultation and percussion with normal breathing effort HEART:  regular rate & rhythm and no murmurs and no lower extremity edema ABDOMEN:abdomen soft, non-tender and normal bowel sounds Musculoskeletal:no cyanosis of digits and no clubbing  PSYCH: alert & oriented x 3 with fluent speech  Laboratory Data: I have reviewed the data as listed   Latest Reference Range & Units 12/28/23 11:17  Sodium 135 - 145 mmol/L 140  Potassium 3.5 - 5.1 mmol/L 3.9  Chloride 98 - 111 mmol/L 103  CO2 22 - 32 mmol/L 27  Glucose 70 - 99 mg/dL 897 (H)  BUN 8 - 23 mg/dL 19  Creatinine 9.55 - 8.99 mg/dL 9.32  Calcium 8.9 - 89.6 mg/dL 8.8 (L)  Anion gap 5 - 15  10  GFR, Estimated >60 mL/min >60  (H): Data is abnormally high (L): Data is abnormally low   Latest Reference Range & Units 12/28/23 11:17  WBC 4.0 - 10.5 K/uL 5.3  RBC 3.87 - 5.11 MIL/uL 4.53  Hemoglobin 12.0 - 15.0 g/dL 87.1  HCT 63.9 - 53.9 % 39.2  MCV 80.0 - 100.0 fL 86.5  MCH 26.0 - 34.0 pg 28.3  MCHC 30.0 -  36.0 g/dL 67.2  RDW 88.4 - 84.4 % 13.2  Platelets 150 - 400 K/uL 302  nRBC 0.0 - 0.2 % 0.0  Neutrophils % 61  Lymphocytes % 27  Monocytes Relative % 10  Eosinophil % 1  Basophil % 1  Immature Granulocytes % 0  NEUT# 1.7 - 7.7 K/uL 3.2  Lymphs Abs 0.7 - 4.0 K/uL 1.5  Monocyte # 0.1 - 1.0 K/uL 0.6  Eosinophils Absolute 0.0 - 0.5 K/uL 0.1  Basophils Absolute 0.0 - 0.1 K/uL 0.0  Abs Immature Granulocytes 0.00 - 0.07 K/uL 0.01  Glucose 70 - 99 mg/dL 897 (H)  (H): Data is abnormally high  Radiographic Studies: I have personally reviewed the radiological images as listed and agreed with the findings in the report. CLINICAL DATA:  No clinical history are indication provided for examination.   EXAM: CT ABDOMEN AND PELVIS WITH CONTRAST   TECHNIQUE: Multidetector CT imaging of the abdomen and pelvis was performed using the standard protocol following bolus administration of intravenous contrast.   RADIATION DOSE REDUCTION: This exam was performed according to the departmental  dose-optimization program which includes automated exposure control, adjustment of the mA and/or kV according to patient size and/or use of iterative reconstruction technique.   CONTRAST:  OMNIPAQUE  IOHEXOL  300 MG/ML  SOLN   COMPARISON:  Ultrasound November 05, 2023.   FINDINGS: Lower chest: Bibasilar atelectasis/scarring.   Hepatobiliary: Tiny hypodensity in the anterior left lobe of the liver on image 14/2 likely reflects a cyst. Gallbladder is unremarkable. No biliary ductal dilation.   Pancreas: No pancreatic ductal dilation or evidence of acute inflammation.   Spleen: No splenomegaly.   Adrenals/Urinary Tract: No suspicious adrenal nodule/mass. No hydronephrosis. Kidneys demonstrate symmetric enhancement. Urinary bladder is obscured by streak artifact from bilateral hip arthroplasties.   Stomach/Bowel: Radiopaque enteric contrast material traverses the ascending colon. Stomach is unremarkable for degree of distension. No pathologic dilation of small or large bowel. Colonic stool burden suggestive constipation. Normal appendix.   Vascular/Lymphatic: Normal caliber abdominal aorta. Scattered aortic atherosclerosis. Smooth IVC contours. The portal, splenic and superior mesenteric veins are patent.   Left inguinal lymph node measures 3.8 x 2.1 cm on image 71/2 previously 4.0 x 2.1 cm on ultrasound.   Reproductive: Obscured by streak artifact from bilateral hip arthroplasties.   Other: No significant abdominopelvic free fluid.   Musculoskeletal: No aggressive lytic or blastic lesion of bone. Mild thoracic spondylosis. Degenerative change of the bilateral hips. Chronic osseous changes of the pubic symphysis.   IMPRESSION: 1. Similar size of left inguinal lymph node given differences in technique consider further evaluation by direct tissue sampling with ultrasound guidance. 2. Colonic stool burden suggestive of constipation. 3.  Aortic Atherosclerosis  (ICD10-I70.0).     Electronically Signed   By: Reyes Holder M.D.   On: 11/27/2023 14:14    ASSESSMENT & PLAN:  Patient is a 63 y.o. 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 Local disease with positive lymph node. Treatment involves chemotherapy and radiation therapy. Chemotherapy options include 5FU via pump or capecitabine pills along with radiation therapy to be administered five days a week. Chemotherapy to be administered in two cycles, with each cycle involving a four-day infusion period. Side effects include nausea, vomiting, and fatigue. Response rates to treatment are high, with 95-97% of patients achieving complete response. She prefers the  pump due to lower risk of nausea compared to pills.  - Ordered CT scan of the chest to rule out metastatic disease. - Referred to radiation oncology for radiation therapy setup. - Scheduled chemotherapy with 5FU via pump for two cycles. Patient preference over the capecitabine pills - Ordered HIV testing. - Provided chemotherapy education session. - Advised on use of donut cushion for hemorrhoid discomfort. - Discussed risks Vs benefits of port placement including bleeding, thrombosis and infection risk. Will refer to surgery for the placement.   Return to clinic with second cycle of chemotherapy to assess for response  Genetic testing Family history of anal cancer in father and sister. Genetic testing recommended due to family history.  - Referred for genetic counseling and testing.   Orders Placed This Encounter  Procedures   IR IMAGING GUIDED PORT INSERTION    Standing Status:   Future    Number of Occurrences:   1    Expected Date:   01/24/2024    Expiration Date:   01/22/2025    Reason for Exam (SYMPTOM  OR DIAGNOSIS REQUIRED):   chemotherapy administration     Preferred Imaging Location?:   [facility]    Release to patient:   Immediate   HIV antibody (with reflex)    Standing Status:   Future    Number of Occurrences:   1    Expected Date:   01/23/2024    Expiration Date:   01/22/2025    Release to patient:   Immediate [1]    Remote health to draw?:   No   CBC with Differential    Standing Status:   Future    Number of Occurrences:   1    Expected Date:   02/19/2024    Expiration Date:   02/18/2025   Comprehensive metabolic panel    Standing Status:   Future    Number of Occurrences:   1    Expected Date:   02/19/2024    Expiration Date:   02/18/2025   CBC with Differential    Standing Status:   Future    Expected Date:   03/18/2024    Expiration Date:   03/18/2025   Comprehensive metabolic panel    Standing Status:   Future    Expected Date:   03/18/2024    Expiration Date:   03/18/2025   Ambulatory referral to Genetics    Referral Priority:   Routine    Referral Type:   Consultation    Referral Reason:   Specialty Services Required    Number of Visits Requested:   1    The total time spent in the appointment was 60 minutes encounter with patients including review of chart and various tests results, discussions about plan of care and coordination of care plan   All questions were answered. The patient knows to call the clinic with any problems, questions or concerns. No barriers to learning was detected.  Mickiel Dry, MD 12/31/202511:21 AM "

## 2024-01-23 ENCOUNTER — Inpatient Hospital Stay: Admitting: Oncology

## 2024-01-23 ENCOUNTER — Encounter: Payer: Self-pay | Admitting: Oncology

## 2024-01-23 ENCOUNTER — Inpatient Hospital Stay

## 2024-01-23 ENCOUNTER — Other Ambulatory Visit: Payer: Self-pay | Admitting: General Surgery

## 2024-01-23 VITALS — BP 131/66 | HR 54 | Temp 97.8°F | Resp 17 | Ht 61.0 in | Wt 122.0 lb

## 2024-01-23 DIAGNOSIS — K649 Unspecified hemorrhoids: Secondary | ICD-10-CM | POA: Insufficient documentation

## 2024-01-23 DIAGNOSIS — C210 Malignant neoplasm of anus, unspecified: Secondary | ICD-10-CM | POA: Insufficient documentation

## 2024-01-23 DIAGNOSIS — Z5111 Encounter for antineoplastic chemotherapy: Secondary | ICD-10-CM | POA: Insufficient documentation

## 2024-01-23 DIAGNOSIS — C774 Secondary and unspecified malignant neoplasm of inguinal and lower limb lymph nodes: Secondary | ICD-10-CM | POA: Diagnosis not present

## 2024-01-23 DIAGNOSIS — Z510 Encounter for antineoplastic radiation therapy: Secondary | ICD-10-CM | POA: Insufficient documentation

## 2024-01-23 LAB — HIV ANTIBODY (ROUTINE TESTING W REFLEX): HIV Screen 4th Generation wRfx: NONREACTIVE

## 2024-01-23 NOTE — Patient Instructions (Addendum)
 [facility]  Discharge Instructions  You were seen and examined today by Dr. Davonna. Dr. Davonna is a medical oncologist, meaning that she specializes in the treatment of cancer diagnoses. Dr. Davonna discussed your past medical history, family history of cancers, and the events that led to you being here today.  You were seen today due to a new diagnosis of Stage II Anal Cancer. This is treated with chemotherapy and radiation therapy.  Dr. Davonna has recommended a referral to radiation oncology with Dr. Dannielle at [facility]. After this visit, we will arrange for a Port-A-Cath and a start date.  Proceed with CT Chest as scheduled.  Dr. Davonna has recommended genetic counseling as well as additional labs today.  Follow-up as scheduled.  Thank you for choosing [name] to provide your oncology and hematology care.   To afford each patient quality time with our provider, please arrive at least 15 minutes before your scheduled appointment time. You may need to reschedule your appointment if you arrive late (10 or more minutes). Arriving late affects you and other patients whose appointments are after yours.  Also, if you miss three or more appointments without notifying the office, you may be dismissed from the clinic at the provider's discretion.    Again, thank you for choosing [facility].  Our hope is that these requests will decrease the amount of time that you wait before being seen by our physicians.   If you have a lab appointment with the Cancer Center - please note that after April 8th, all labs will be drawn in the cancer center.  You do not have to check in or register with the main entrance as you have in the past but will complete your check-in at the cancer center.            _____________________________________________________________  Should you have questions after your visit to [facility], please contact our office at [phone] and follow the prompts.  Our office hours are 8:00 a.m. to 4:30 p.m. Monday - Thursday and 8:00 a.m. to 2:30 p.m. Friday.  Please note that voicemails left after 4:00 p.m. may not be returned until the following business day.  We are closed weekends and all major holidays.  You do have access to a nurse 24-7, just call the main number to the clinic [phone] and do not press any options, hold on the line and a nurse will answer the phone.    For prescription refill requests, have your pharmacy contact our office and allow 72 hours.    Masks are no longer required in the cancer centers. If you would like for your care team to wear a mask while they are taking care of you, please let them know. You may have one support person who is at least 65 years old accompany you for your appointments.

## 2024-01-24 ENCOUNTER — Ambulatory Visit
Admission: RE | Admit: 2024-01-24 | Discharge: 2024-01-24 | Disposition: A | Discharge status: Home / Self Care | Source: Ambulatory Visit | Attending: General Surgery | Admitting: General Surgery

## 2024-01-24 DIAGNOSIS — C210 Malignant neoplasm of anus, unspecified: Secondary | ICD-10-CM

## 2024-01-24 MED ORDER — IOPAMIDOL (ISOVUE-370) INJECTION 76%
70.0000 mL | Freq: Once | INTRAVENOUS | Status: AC | PRN
  Administered 2024-01-24: 70 mL via INTRAVENOUS

## 2024-01-25 ENCOUNTER — Other Ambulatory Visit: Payer: Self-pay

## 2024-01-25 DIAGNOSIS — C210 Malignant neoplasm of anus, unspecified: Secondary | ICD-10-CM

## 2024-01-25 NOTE — Progress Notes (Signed)
 Call received regarding patient insurance coverage at [facility]. Spoke with RN at [employer] who confirms that patient is out of network. Referral placed to [facility]. Patient verbalized understanding of the above.

## 2024-01-28 DIAGNOSIS — C210 Malignant neoplasm of anus, unspecified: Secondary | ICD-10-CM | POA: Insufficient documentation

## 2024-01-28 NOTE — Progress Notes (Signed)
 Radiation Oncology         [phone]) [phone] ________________________________  Name: Brandi Mitchell        MRN: [id number]  Date of Service: 01/30/2024 DOB: [birth year]  RR:[id number], [name]  Davonna Siad, MD     REFERRING PHYSICIAN: Davonna Siad, MD   DIAGNOSIS: The encounter diagnosis was Anal cancer (HCC).   HISTORY OF PRESENT ILLNESS: Brandi Mitchell is a 63 y.o. female seen at the request of Dr. Davonna for a diagnosis of anal cancer.  The patient had noticed bleeding hemorrhoids and was found to have a left inguinal lymph node swelling.  After a CT abdomen pelvis with contrast on 11/24/2023 showed a 3.8 cm left inguinal lymph node and she was referred to surgery and met with Dr. Mavis.  She subsequently underwent internal and external simple hemorrhoidectomy and left inguinal lymph node excision on 01/02/2024.  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  The patient was referred to colorectal surgery and to medical oncology. Dr. Debby did not think that a PET scan would be a reliable evaluation of her disease in the pelvis given her recent surgery.  Rather, a CT chest on 01/24/2024 that did not show evidence of metastatic disease.  She was offered chemoradiation and a referral to [facility] however her insurance is considered out of network and she is seen in our clinic to consider chemoradiation.    PREVIOUS RADIATION THERAPY: {EXAM; YES/NO:19492::No}   PAST MEDICAL HISTORY:  Past Medical History:  Diagnosis Date   Degenerative joint disease (DJD) of hip    left hip   Heart murmur    I have had it all my life but no issues with it   Hypothyroidism    Migraine headache    Thyroid  disease        PAST  SURGICAL HISTORY: Past Surgical History:  Procedure Laterality Date   ABDOMINAL HYSTERECTOMY     BREAST EXCISIONAL BIOPSY Left    CESAREAN SECTION     FOOT SURGERY     HEMORRHOID SURGERY N/A 01/02/2024   Procedure: HEMORRHOIDECTOMY;  Surgeon: Mavis Anes, MD;  Location: [facility];  Service: General;  Laterality: N/A;   INGUINAL LYMPH NODE BIOPSY Left 01/02/2024   Procedure: BIOPSY, LYMPH NODE, INGUINAL, OPEN;  Surgeon: Mavis Anes, MD;  Location: [facility];  Service: General;  Laterality: Left;   TMJ ARTHROPLASTY     TONSILLECTOMY     TOTAL HIP ARTHROPLASTY Left 03/13/2017   Procedure: TOTAL HIP ARTHROPLASTY ANTERIOR APPROACH;  Surgeon: Sheril Coy, MD;  Location: [facility];  Service: Orthopedics;  Laterality: Left;   TOTAL HIP ARTHROPLASTY Right 10/30/2017   Procedure: TOTAL HIP ARTHROPLASTY ANTERIOR APPROACH;  Surgeon: Sheril Coy, MD;  Location: [facility];  Service: Orthopedics;  Laterality: Right;   TRIGGER FINGER RELEASE Left      FAMILY HISTORY: No family history on file.   SOCIAL HISTORY:  reports that she has never smoked. She has never used smokeless tobacco. She reports that she does not drink alcohol and does not use drugs.   ALLERGIES: Levothyroxine  sodium, Oxycodone hcl, Indomethacin, Keflex [cephalexin], Percocet [oxycodone-acetaminophen ], and Tramadol   MEDICATIONS:  Current Outpatient Medications  Medication Sig Dispense Refill   amLODipine (NORVASC) 5 MG tablet Take 5 mg by mouth daily.  Calcium-Magnesium-Vitamin D (CITRACAL CALCIUM +D3) 600-40-500 MG-MG-UNIT TB24 Take 1 tablet by mouth daily at 12 noon.     cyanocobalamin 1000 MCG tablet Take 1,000 mcg by mouth daily.     levothyroxine  (SYNTHROID ) 88 MCG tablet Take 1 tablet by mouth daily. Brand name Synthroid      Omega-3 Fatty Acids (FISH OIL CONCENTRATE) 1000 MG CAPS Take 1,000 mg by mouth in the morning.     telmisartan (MICARDIS) 40 MG tablet Take 1 tablet by mouth every evening.     No current  facility-administered medications for this visit.     REVIEW OF SYSTEMS: On review of systems, the patient reports that ***      PHYSICAL EXAM:  Wt Readings from Last 3 Encounters:  01/23/24 122 lb (55.3 kg)  01/08/24 125 lb (56.7 kg)  01/02/24 128 lb (58.1 kg)   Temp Readings from Last 3 Encounters:  01/23/24 97.8 F (36.6 C) (Tympanic)  01/08/24 97.8 F (36.6 C) (Oral)  01/02/24 98 F (36.7 C) (Oral)   BP Readings from Last 3 Encounters:  01/23/24 131/66  01/08/24 128/78  01/02/24 137/83   Pulse Readings from Last 3 Encounters:  01/23/24 (!) 54  01/08/24 64  01/02/24 (!) 59    /10  In general this is a well appearing Caucasian female in no acute distress. She's alert and oriented x4 and appropriate throughout the examination. Cardiopulmonary assessment is negative for acute distress and she exhibits normal effort.     ECOG = ***  0 - Asymptomatic (Fully active, able to carry on all predisease activities without restriction)  1 - Symptomatic but completely ambulatory (Restricted in physically strenuous activity but ambulatory and able to carry out work of a light or sedentary nature. For example, light housework, office work)  2 - Symptomatic, <50% in bed during the day (Ambulatory and capable of all self care but unable to carry out any work activities. Up and about more than 50% of waking hours)  3 - Symptomatic, >50% in bed, but not bedbound (Capable of only limited self-care, confined to bed or chair 50% or more of waking hours)  4 - Bedbound (Completely disabled. Cannot carry on any self-care. Totally confined to bed or chair)  5 - Death   Raylene MM, Creech RH, Tormey DC, et al. [phone]). Toxicity and response criteria of the [facility]. Am. DOROTHA Bridges. Oncol. 5 (6): 649-55    LABORATORY DATA:  Lab Results  Component Value Date   WBC 5.3 12/28/2023   HGB 12.8 12/28/2023   HCT 39.2 12/28/2023   MCV 86.5 12/28/2023   PLT 302  12/28/2023   Lab Results  Component Value Date   NA 140 12/28/2023   K 3.9 12/28/2023   CL 103 12/28/2023   CO2 27 12/28/2023   No results found for: ALT, AST, GGT, ALKPHOS, BILITOT    RADIOGRAPHY: CT CHEST W CONTRAST Result Date: 01/26/2024 EXAM: CT CHEST WITH CONTRAST 01/24/2024 09:36:54 AM TECHNIQUE: CT of the chest was performed with the administration of 70 mL of iopamidol  (ISOVUE -370) 76% injection. Multiplanar reformatted images are provided for review. Automated exposure control, iterative reconstruction, and/or weight based adjustment of the mA/kV was utilized to reduce the radiation dose to as low as reasonably achievable. COMPARISON: No prior study available for comparison. CLINICAL HISTORY: FINDINGS: MEDIASTINUM: Normal heart size. No substantial pericardial effusion. Coronary artery atherosclerotic calcification evident. Mild atherosclerotic calcification is noted in the wall of the thoracic aorta. The central airways are clear.  LYMPH NODES: No mediastinal, hilar or axillary lymphadenopathy. LUNGS AND PLEURA: Calcified granuloma noted in the right lower lobe. No suspicious pulmonary nodule or mass. No focal consolidation or pulmonary edema. No pleural effusion or pneumothorax. SOFT TISSUES/BONES: No acute abnormality of the bones or soft tissues. UPPER ABDOMEN: Limited images of the upper abdomen demonstrate a tiny hypodensity anterior left liver, stable since abdomen CT of 01/24/2024, most likely benign, and technically too small to characterize. IMPRESSION: 1. No evidence for metastatic disease in the chest. Electronically signed by: Camellia Candle MD 01/26/2024 12:26 PM EST RP Workstation: [id number]       IMPRESSION/PLAN: 1. Squamous cell carcinoma of the anus involving the inguinal lymph node station.  Dr. Dewey discusses the patient's workup thus far including her pathology and imaging to date.  The patient has not had a PET scan given the recency of her surgery in the  area of her disease, he does not feel that this needs to be performed in order to proceed, and recommends a course of 6 weeks of chemoradiation.  We discussed the risks, benefits, short and long-term effects of radiotherapy as well as the delivery and logistics, and the patient is interested in proceeding. Written consent is obtained and placed in the chart, a copy was provided to the patient.  She will simulate on***. 2. Risks of pelvic floor dysfunction from radiotherapy. We discussed the importance of evaluation with physical therapy prior to pelvic radiation. A referral was placed to physical therapy today.    In a visit lasting *** minutes, greater than 50% of the time was spent face to face discussing the patient's condition, in preparation for the discussion, and coordinating the patient's care.   The above documentation reflects my direct findings during this shared patient visit. Please see the separate note by Dr. Dewey on this date for the remainder of the patient's plan of care.    Donald KYM Husband, [facility]   **Disclaimer: This note was dictated with voice recognition software. Similar sounding words can inadvertently be transcribed and this note may contain transcription errors which may not have been corrected upon publication of note.**

## 2024-01-29 ENCOUNTER — Other Ambulatory Visit: Payer: Self-pay | Admitting: Radiation Oncology

## 2024-01-29 DIAGNOSIS — C210 Malignant neoplasm of anus, unspecified: Secondary | ICD-10-CM

## 2024-01-29 NOTE — Progress Notes (Signed)
 Message received from XRT PA, Donald that they can be prepared to start the patient's treatment 12/22. Called patient to confirm that she was agreeable to start date. Patient agreeable, Donald aware.

## 2024-01-30 ENCOUNTER — Telehealth: Payer: Self-pay | Admitting: Radiation Oncology

## 2024-01-30 ENCOUNTER — Encounter: Payer: Self-pay | Admitting: Radiation Oncology

## 2024-01-30 ENCOUNTER — Ambulatory Visit
Admission: RE | Admit: 2024-01-30 | Discharge: 2024-01-30 | Attending: Radiation Oncology | Admitting: Radiation Oncology

## 2024-01-30 ENCOUNTER — Ambulatory Visit
Admission: RE | Admit: 2024-01-30 | Discharge: 2024-01-30 | Disposition: A | Discharge status: Home / Self Care | Source: Ambulatory Visit | Attending: Radiation Oncology | Admitting: Radiation Oncology

## 2024-01-30 VITALS — BP 138/72 | HR 63 | Temp 97.9°F | Resp 18 | Ht 61.0 in | Wt 123.6 lb

## 2024-01-30 DIAGNOSIS — Z79890 Hormone replacement therapy (postmenopausal): Secondary | ICD-10-CM | POA: Insufficient documentation

## 2024-01-30 DIAGNOSIS — Z79899 Other long term (current) drug therapy: Secondary | ICD-10-CM | POA: Insufficient documentation

## 2024-01-30 DIAGNOSIS — C210 Malignant neoplasm of anus, unspecified: Secondary | ICD-10-CM

## 2024-01-30 DIAGNOSIS — R011 Cardiac murmur, unspecified: Secondary | ICD-10-CM | POA: Insufficient documentation

## 2024-01-30 DIAGNOSIS — G43909 Migraine, unspecified, not intractable, without status migrainosus: Secondary | ICD-10-CM | POA: Insufficient documentation

## 2024-01-30 DIAGNOSIS — E039 Hypothyroidism, unspecified: Secondary | ICD-10-CM | POA: Insufficient documentation

## 2024-01-30 DIAGNOSIS — Z800 Family history of malignant neoplasm of digestive organs: Secondary | ICD-10-CM | POA: Insufficient documentation

## 2024-01-30 DIAGNOSIS — Z808 Family history of malignant neoplasm of other organs or systems: Secondary | ICD-10-CM | POA: Insufficient documentation

## 2024-01-30 DIAGNOSIS — M1610 Unilateral primary osteoarthritis, unspecified hip: Secondary | ICD-10-CM | POA: Insufficient documentation

## 2024-01-30 NOTE — Telephone Encounter (Signed)
 12/10 Forward outgoing referral to [facility] - Physical Therapy - [facility] [place].

## 2024-01-30 NOTE — Progress Notes (Signed)
 GI Location of Tumor / Histology: Anal Cancer  Brandi Mitchell presented with bleeding hemorrhoids and was found to have a left inguinal lymph node swelling on CT imaging.   Biopsies of    Past/Anticipated interventions by surgeon, if any:  Dr. Debby 01/21/2024 -63 year old female who recently underwent excision of an anal mass and enlarged lymph node. Both of these showed squamous cell carcinoma.  -We will order a CT scan of her chest to complete her metastatic workup. I do not think a PET scan will be helpful in the face of recent surgery.  -Given her locally advanced disease, I think she would benefit from chemotherapy and radiation.    Past/Anticipated interventions by medical oncology, if any:  Dr. Davonna 01/23/2024   Weight changes, if any: Stable.  Bowel/Bladder complaints, if any: Regular  Nausea / Vomiting, if any: No  Pain issues, if any: No   Any blood per rectum: No     Anal Patient -Abnormal pap smear: No   SAFETY ISSUES: Prior radiation? No Pacemaker/ICD? No Possible current pregnancy? Hysterectomy Is the patient on methotrexate? No  Current Complaints/Details: Port Insertion 02/15/2024  Thyroidectomy, took iodine pill.

## 2024-01-31 ENCOUNTER — Inpatient Hospital Stay: Admitting: Licensed Clinical Social Worker

## 2024-01-31 ENCOUNTER — Encounter: Payer: Self-pay | Admitting: Licensed Clinical Social Worker

## 2024-01-31 ENCOUNTER — Encounter: Payer: Self-pay | Admitting: Physical Therapy

## 2024-01-31 ENCOUNTER — Ambulatory Visit: Attending: Radiation Oncology | Admitting: Physical Therapy

## 2024-01-31 DIAGNOSIS — C210 Malignant neoplasm of anus, unspecified: Secondary | ICD-10-CM

## 2024-01-31 DIAGNOSIS — Z800 Family history of malignant neoplasm of digestive organs: Secondary | ICD-10-CM

## 2024-01-31 DIAGNOSIS — Z808 Family history of malignant neoplasm of other organs or systems: Secondary | ICD-10-CM

## 2024-01-31 DIAGNOSIS — M6281 Muscle weakness (generalized): Secondary | ICD-10-CM | POA: Insufficient documentation

## 2024-01-31 NOTE — Therapy (Unsigned)
 OUTPATIENT PHYSICAL THERAPY FEMALE PELVIC EVALUATION   Patient Name: Brandi Mitchell MRN: [id number] DOB:[birth year], 63 y.o., female Today's Date: 02/01/2024  END OF SESSION:  PT End of Session - 01/31/24 1304     Visit Number 1    Date for Recertification  07/31/24    Authorization Type [employer]    PT Start Time 1300    PT Stop Time 1345    PT Time Calculation (min) 45 min    Activity Tolerance Patient tolerated treatment well    Behavior During Therapy [facility] for tasks assessed/performed          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   Heart murmur    I have had it all my life but no issues with it   Hypothyroidism    Migraine headache    Thyroid  disease    Past Surgical History:  Procedure Laterality Date   ABDOMINAL HYSTERECTOMY     BREAST EXCISIONAL BIOPSY Left    CESAREAN SECTION     FOOT SURGERY     HEMORRHOID SURGERY N/A 01/02/2024   Procedure: HEMORRHOIDECTOMY;  Surgeon: Mavis Anes, MD;  Location: [facility];  Service: General;  Laterality: N/A;   INGUINAL LYMPH NODE BIOPSY Left 01/02/2024   Procedure: BIOPSY, LYMPH NODE, INGUINAL, OPEN;  Surgeon: Mavis Anes, MD;  Location: [facility];  Service: General;  Laterality: Left;   TMJ ARTHROPLASTY     TONSILLECTOMY     TOTAL HIP ARTHROPLASTY Left 03/13/2017   Procedure: TOTAL HIP ARTHROPLASTY ANTERIOR APPROACH;  Surgeon: Sheril Coy, MD;  Location: [facility];  Service: Orthopedics;  Laterality: Left;   TOTAL HIP ARTHROPLASTY Right 10/30/2017   Procedure: TOTAL HIP ARTHROPLASTY ANTERIOR APPROACH;  Surgeon: Sheril Coy, MD;  Location: [facility];  Service: Orthopedics;  Laterality: Right;   TRIGGER FINGER RELEASE Left    Patient Active Problem List   Diagnosis Date Noted   Anal cancer (HCC) 01/28/2024   Lymph node enlargement 01/02/2024   Bleeding hemorrhoid 01/02/2024   Age-related osteoporosis without current pathological fracture  08/25/2021   Amnesia 08/25/2021   Arm paresthesia, left 08/25/2021   Carpal tunnel syndrome 08/25/2021   Chronic pain 08/25/2021   Disorder of female genital organs 08/25/2021   Essential hypertension 08/25/2021   History of adenomatous polyp of colon 08/25/2021   Hypothyroidism 08/25/2021   Migraine, unspecified, not intractable, without status migrainosus 08/25/2021   Osteopenia 08/25/2021   Osteoporosis 08/25/2021   Other specified nonpsychotic mental disorders 08/25/2021   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   Postmenopausal atrophic vaginitis 08/25/2021   Skin sensation disturbance 08/25/2021   Stress incontinence (female) (female) 08/25/2021   Primary osteoarthritis of right hip 10/30/2017   Primary osteoarthritis of left hip 03/13/2017    PCP: none  REFERRING PROVIDER: Lanell Donald Stagger, PA-C   REFERRING DIAG: C21.0 (ICD-10-CM) - Anal cancer (HCC)   THERAPY DIAG:  Muscle weakness (generalized) - Plan: PT plan of care cert/re-cert  Anal cancer (HCC) - Plan: PT plan of care cert/re-cert  Rationale for Evaluation and Treatment: Rehabilitation  ONSET DATE: 01/08/24  SUBJECTIVE:  SUBJECTIVE STATEMENT: Anal cancer diagnosed on 01/08/24 Pre radiation evaluation Pt to start radiation on 12/29 Has dilators already too! 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 Yes NPRS scale: 1/10 Pain location: Anal  Pain type: discomfort Pain description:  intermittent   Aggravating factors: bowel movement Relieving factors: no bowel movement  PRECAUTIONS: Other: anal cancer  RED FLAGS: None   WEIGHT BEARING RESTRICTIONS: No  FALLS:  Has patient fallen in last 6 months? 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    BOWEL MOVEMENT: Pain with bowel movement: Yes, discomfort Type of bowel movement:Type (Bristol Stool Scale) Type 4, Frequency daily, Strain no, and Splinting no Fully empty rectum: No Leakage: No                                                 Bowel urgency: no Pads: No Fiber supplement/laxative No  URINATION: Pain with urination: No Fully empty bladder: Yes:                                           Post-void dribble: No Stream: Strong Urgency: No Frequency:during the day every 2-3 hours                                                        Nocturia: No   Leakage: none Pads/briefs: No  INTERCOURSE:  Ability to have vaginal penetration Yes  Pain with intercourse: none Dryness: Yes  Climax: yes Marinoff Scale: 0/3  PREGNANCY: Vaginal deliveries 1 Tearing No Episiotomy No C-section deliveries 1 Currently pregnant No  PROLAPSE: None   OBJECTIVE:  Note: Objective measures were completed at Evaluation unless otherwise noted.  DIAGNOSTIC FINDINGS:  FINAL MICROSCOPIC DIAGNOSIS:  A. HEMORRHOID, HEMORRHOIDECTOMY: -  Invasive moderately differentiated squamous cell carcinoma (depth of invasion 5 mm -  An unoriented lateral margin is positive.   The deep margin negative but is less than 1 mm.  Note: Dr. Rebbecca has peer reviewed the case and agrees with the interpretation.  Initial results were discussed with Dr. Mavis on 01/03/2024  B. LYMPH NODE, LEFT INGUINAL, BIOPSY: -  Metastatic moderately differentiated  squamous cell carcinoma.   PATIENT SURVEYS:  PFIQ-7: 0 UIQ-7 0 CRAIG -7 0 Female Sexual Function Index (FSFI) Questionnaire 0  COGNITION: Overall cognitive status: Within functional limits for tasks assessed     SENSATION: Light touch: Appears intact   FUNCTIONAL TESTS:  5 times sit to stand: 11 sec 6 minute walk test: 1745 feet Single leg stance:  Rt:no pelvic drop  Lt:no pelvic drop Sit-up test:able to go into sitting without arm support Squat:able to go fully down Bed mobility:  GAIT: Assistive device utilized: None  POSTURE: No Significant postural limitations   LUMBARAROM/PROM: Lumbar ROIM is full   LOWER EXTREMITY MNF:qloo bilateral hip ROM   LOWER EXTREMITY MMT:  MMT Right eval Left eval  Hip abduction 4/5 5/5   (Blank rows = not tested) PALPATION: Pelvic Alignment: ASIS  Abdominal: at first contract the abdominals  with hinging at the thoracic lumbar area but after she was taught to contract the abdominals with lower abdominals was able to sit up without difficulty  Diastasis: No Distortion: No                  External Perineal Exam: not assessed at this time                             Internal Pelvic Floor: not assessed at this time  Patient confirms identification and approves PT to assess internal pelvic floor and treatment No; Active cancer and recovering from Hemorrhoidectomy.  All internal or external pelvic floor assessments and/or treatments are completed with proper hand hygiene and gloves hands. If needed gloves are changed with hand hygiene during patient care time.  PELVIC MMT:   MMT eval  Vaginal   Internal Anal Sphincter   External Anal Sphincter   Puborectalis   (Blank rows = not tested)        TODAY'S TREATMENT:                                                                                                                              DATE: 01/31/24  EVAL Examination completed, findings reviewed, pt educated on POC, HEP,  and female pelvic floor anatomy, reasoning with pelvic floor assessment internally with pt consent, and abdominal massage. Pt motivated to participate in PT and agreeable to attempt recommendations.     PATIENT EDUCATION:  01/31/24 Education details: Access Code: [email or web address], rectal care, pelvic floor contraction, energy conservation, continues with her yoga and gym program Person educated: Patient Education method: Explanation, Demonstration, Tactile cues, Verbal cues, and Handouts Education comprehension: verbalized understanding, returned demonstration, verbal cues required, tactile cues required, and needs further education  HOME EXERCISE PROGRAM: 01/31/24 Access Code: [email or web address] URL: [email or web address] Date: 01/31/2024 Prepared by: Channing Pereyra  Exercises - Seated Pelvic Floor Contraction  - 3 x daily - 7 x weekly - 1 sets - 10 reps - Seated Quick Flick Pelvic Floor Contractions  - 3 x daily - 7 x weekly - 1 sets - 5 reps  ASSESSMENT:  CLINICAL IMPRESSION: Patient is a 63 y.o. female who was seen today for physical therapy evaluation and treatment for anal cancer. Patient was diagnosis  on 01/08/24. She will have 5 weeks fo radiation and chemothrapy starting on 02/18/24.  She is able to urinate without difficulty. She is having bowel movements regularly. She was given dilators to be used after she completes round of radiation. She exercises regularly at the  gym and understands to continue to assist with the recovery after her treatments. She is able to walk  1745 feet in 6 minutes and sit to stand 11 seconds. She understands changes that may happen with radiation to the vaginal area and rectum. She understands the ways to continue with her yoga to keep her hip mobility and pelvic floor lengthening. Patient was educated on how to care for the rectal and perineal area during radiation. Patient will return to be reassessed after her treatments to see what deficits have begun and  to work on them.   OBJECTIVE IMPAIRMENTS: decreased activity tolerance and decreased education for care of perineal area and overall health during her chemotherapy radiation treatments.   ACTIVITY LIMITATIONS: not at this time but can change after treatment.   PARTICIPATION LIMITATIONS: community activity  PERSONAL FACTORS: 1 comorbidity: anal cancer are also affecting patient's functional outcome.   REHAB POTENTIAL: Excellent  CLINICAL DECISION MAKING: Evolving/moderate complexity  EVALUATION COMPLEXITY: Moderate   GOALS: Goals reviewed with patient? Yes  SHORT TERM GOALS: Target date: 02/28/24  Patient educated on bowel health.  Baseline: Goal status: Met 02/01/24  2.  Patient educated on energy conservation for during her cancer treatments Baseline:  Goal status: Met 02/01/24  3.  Patient educated on continuing her exercise program to keep her energy and assist with recovery from the cancer treatments.  Baseline:  Goal status: Met 02/01/24   LONG TERM GOALS: Target date: 07/31/24  Patient independent with her advanced HEP.  Baseline:  Goal status: INITIAL  2.  Patient reassessed after her cancer treatments with new goals in place.  Baseline:  Goal status: INITIAL   PLAN:  PT FREQUENCY: 1x/week  PT DURATION: 6 months  PLANNED INTERVENTIONS: 97110-Therapeutic exercises, 97530- Therapeutic activity, 97112- Neuromuscular re-education, [phone]- Self Care, [id number]- Manual therapy, Patient/Family education, Balance training, Joint mobilization, Spinal mobilization, and Biofeedback  PLAN FOR NEXT SESSION: assess patient after cancer treatments; educate on use of dilators, make new goals   Channing Pereyra, PT 02/01/2024 11:14 AM

## 2024-01-31 NOTE — Patient Instructions (Signed)
 Skin Care and Bowel Hygiene  Anyone who has frequent bowel movements, diarrhea, or bowel leakage (fecal incontinence) may experience soreness or skin irritation around the anal region.  Occasionally, the skin can become so inflamed that it breaks into open sores.  Prevent skin breakdown by following good skin care habits.  Cleaning and Washing Techniques After having a bowel movement, men and women should tighten their anal sphincter before wiping.  Women should always wipe from front to back to prevent fecal matter from getting into the urethra and vagina.   Tips for Cleaning and Washing wipe from front to back towards the anus always wipe gently with soft toilet paper, or ideally with moist toilet paper wipe only once with each piece of toilet paper so as not to re-contaminate the area wash in warm water alone or with a minimal amount of mild, fragrance-free soap use non-biological washing powder gently pat skin completely dry, avoiding rubbing if drying the skin after washing is difficult or uncomfortable, try using a hairdryer on a low setting (use very carefully) allow air to get to the irritated area for some part of every day use protective skin creams containing zinc as recommended by your doctor  What To Avoid baths with extra-hot water soaking for long periods of time in the bathtub disinfectants and antiseptics  bath oils, bath salts, and talcum powder using plastic pants, pads, and sheets, which cause sweating scratching at the irritated area  Additional Tips some people find that citrus and acidic foods cause or worsen skin irritation eat a healthy, balanced diet that is high in fiber  drink plenty of fluids wear cotton underwear to allow the skin to breath talk to your healthcare provider about further treatment options; persistent problems need medical attention  Tips for Energy Conservation for Activities of Daily Living Plan ahead to avoid rushing. Sit down to bathe  and dry off. Wear a terry robe instead of drying off. Use a shower/bath organizer to decrease leaning and reaching. Use extension handles on sponges and brushes. Install grab rails in the bathroom or use an elevated toilet seat. Lay out clothes and toiletries before dressing. Minimize leaning over to put on clothes and shoes. Bring your foot to your knee to apply socks and shoes. Wear comfortable shoes and low-heeled, slip on shoes. Wear button front shirts rather than pullovers. Housekeeping Schedule household tasks throughout the week. Do housework sitting down when possible. Delegate heavy housework, shopping, laundry and child care when possible. Drag or slide objects rather than lifting. Sit when ironing and take rest periods. Stop working before becoming overly tired. Retail Banker by aisle. Use a grocery cart for support. Shop at less busy times. Ask for help with getting to the car. Meal Preparation Use convenience and easy-to-prepare foods. Use small appliances that take less effort to use. Prepare meals sitting down. Soak dishes instead of scrubbing and let dishes air dry. Prepare double portions and freeze half. Child Care Plan activities that can be done sitting down, such as drawing pictures, playing games, reading, and computer games. Encourage children to climb up onto your lap or into the highchair instead of being lifted. Make a game of the household chores so that children will want to help. Delegate child care when possible.   Pelvic Floor Exercises for Bowel Control Exercises using both the external anal sphincter and the deep pelvic floor muscles can help you to improve your bowel control. When done correctly, these exercises can tone and strengthen  the muscles to help you hold back gas and prevent fecal incontinence (leakage of stool). Exercise programs take time; you may not see any noticeable change in your bowel control immediately.  In some cases it  may take several months to regain control. Bowel Control Muscles The anus and the anal canal, has rings of muscle around it. The outer ring of muscle is called the external anal sphincter; it is a voluntary muscle which you can learn to tighten and close more efficiently. When you contract it you will feel the skin around your anus tighten and pull in as if the anus is winking. Try to keep the buttocks muscles relaxed. The inner ring around the anus is the internal anal sphincter. It is an involuntary and automatic muscle; you don't have to think to keep it closed or open.  This muscle should be closed at all times, except when you are actually trying to have a bowel movement.  In addition to the sphincter muscles, there are deeper muscles called the levator ani that form a sling from your tailbone to your pubic bone. The levator ani muscle has a specific part called the puborectalis that holds stool in until you give the signal to relax and empty.  When you contract these muscles it creates a feeling of lifting the anus inward.   External Anal Sphincter     Levator ani deep layer   Effective Exercises for Control of Gas and Bowels Identify the specific areas of the pelvic floor muscles you need to use.  This can be done using a mirror to see if you are contracting the correct muscles or by placing the pad of your finger at or just inside the anal opening. Develop an exercise plan for strength, endurance and quick response of the muscles and stick with it.  You must make the muscles do more than they are used to doing. Incorporate the exercises into your daily activities.   Brandi Mitchell, PT [facility] [address], [address] [place], [place] [address] W: [phone] [email or web address]

## 2024-01-31 NOTE — Addendum Note (Signed)
 Encounter addended by: Dewey Rush, MD on: 01/31/2024 2:13 PM  Actions taken: SmartForm saved

## 2024-01-31 NOTE — Progress Notes (Signed)
 REFERRING PROVIDER: Davonna Siad, MD [phone] [address] [place],  [place] [address]  PRIMARY PROVIDER:  Patient, [name]  PRIMARY REASON FOR VISIT:  1. Anal cancer (HCC)   2. Family history of anal cancer   3. Family history of brain cancer   4. Family history of skin cancer      HISTORY OF PRESENT ILLNESS:   Brandi Mitchell, a 63 y.o. female, was seen for a [place] cancer genetics consultation at the request of Dr. Davonna due to a family history of cancer.  Brandi Mitchell presents to clinic today to discuss the possibility of a hereditary predisposition to cancer, genetic testing, and to further clarify her future cancer risks, as well as potential cancer risks for family members.   CANCER HISTORY:  In 2025, at the age of 83, Brandi Mitchell was diagnosed with anal cancer, the treatment plan includes chemoradiation.  RELEVANT MEDICAL HISTORY:  Menarche was at age 43.  First live birth at age 22.  Ovaries intact: no.  Hysterectomy: yes.  Menopausal status: postmenopausal.  Mammogram within the last year: yes.  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   Heart murmur    I have had it all my life but no issues with it   Hypothyroidism    Migraine headache    Thyroid  disease     Past Surgical History:  Procedure Laterality Date   ABDOMINAL HYSTERECTOMY     BREAST EXCISIONAL BIOPSY Left    CESAREAN SECTION     FOOT SURGERY     HEMORRHOID SURGERY N/A 01/02/2024   Procedure: HEMORRHOIDECTOMY;  Surgeon: Mavis Anes, MD;  Location: [facility];  Service: General;  Laterality: N/A;   INGUINAL LYMPH NODE BIOPSY Left 01/02/2024   Procedure: BIOPSY, LYMPH NODE, INGUINAL, OPEN;  Surgeon: Mavis Anes, MD;  Location: [facility];  Service: General;  Laterality: Left;   TMJ ARTHROPLASTY     TONSILLECTOMY     TOTAL HIP ARTHROPLASTY Left 03/13/2017   Procedure: TOTAL HIP ARTHROPLASTY ANTERIOR APPROACH;  Surgeon: Sheril Coy, MD;  Location: [facility];  Service: Orthopedics;  Laterality: Left;   TOTAL HIP ARTHROPLASTY Right 10/30/2017   Procedure: TOTAL HIP ARTHROPLASTY ANTERIOR APPROACH;  Surgeon: Sheril Coy, MD;  Location: [facility] OR;  Service: Orthopedics;  Laterality: Right;   TRIGGER FINGER RELEASE Left     FAMILY HISTORY:  We obtained a detailed, 4-generation family history.  Significant diagnoses are listed below: Family History  Problem Relation Age of Onset   Skin cancer Mother    Brain cancer Father        dx 61s   Cancer - Other Sister        anal dx 50s   Skin cancer Maternal Uncle    Skin cancer Maternal Grandmother    Lung cancer Maternal Grandfather    Brandi Mitchell had 2 biological sons and 1 adopted daughter. One of her sons passed at 55, he had cerebral palsy. Brandi Mitchell has a sister and a brother. Her sister had anal cancer in her 48s treated at [facility]   Brandi Mitchell mother has had skin cancer, she is living at 30, she has had sun exposure. Maternal uncle also has had skin cancer and possibly one melanoma. Maternal grandmother had skin cancer. Maternal grandfather had lung and skin cancers.   Brandi Mitchell father had brain cancer and passed of it in his  80s. No other known cancers on this side of the family.  Brandi Mitchell is unaware of previous family history of genetic testing for hereditary cancer risks. There is no reported Ashkenazi Jewish ancestry. There is no known consanguinity.    GENETIC COUNSELING ASSESSMENT: Brandi Mitchell is a 63 y.o. female with a personal and family history of anal cancer which is not particularly suggestive of a hereditary cancer syndrome and predisposition to cancer. We, therefore, discussed and recommended the following at today's visit.   DISCUSSION: We discussed that, in general, most cancer is not inherited in families, but instead is sporadic or familial. We discussed that approximately 10% of cancer is hereditary, however anal cancer, lung and skin cancer are  usually not hereditary.  We discussed that testing is beneficial for several reasons including knowing about cancer risks, identifying potential screening and risk-reduction options that may be appropriate, and to understand if other family members could be at risk for cancer and allow them to undergo genetic testing.   We discussed with Brandi Mitchell that the personal and family history does not meet insurance or NCCN criteria for genetic testing and, therefore, is not highly consistent with a familial hereditary cancer syndrome.  We feel she is at low risk to harbor  a gene mutation associated with such a condition. Thus, we did not recommend any genetic testing, at this time, and recommended Brandi Mitchell continue to follow the cancer screening guidelines given by her primary healthcare provider. Should she still like to pursue genetic testing, she could consider the [facility] CancerNext+RNA panel for a self pay price of $249.   PLAN: After considering the risks, benefits, and limitations, Brandi Mitchell did not wish to pursue genetic testing at today's visit. We understand this decision and remain available to coordinate genetic testing at any time in the future. We, therefore, recommend Brandi Mitchell continue to follow the cancer screening guidelines given by her primary healthcare provider.  Brandi Mitchell questions were answered to her satisfaction today. Our contact information was provided should additional questions or concerns arise. Thank you for the referral and allowing us  to share in the care of your patient.   Dena Cary, MS, [facility] Genetic Counselor [email or web address] Phone: [phone]  I personally spent a total of 30 minutes in the care of the patient today including counseling and educating and documenting clinical information in the EHR. Dr. Delinda was available for discussion regarding this case.   _______________________________________________________________________ For  Office Staff:  Number of people involved in session: 1 Was an Intern/ student involved with case: no

## 2024-02-01 ENCOUNTER — Ambulatory Visit
Admission: RE | Admit: 2024-02-01 | Discharge: 2024-02-01 | Attending: Radiation Oncology | Admitting: Radiation Oncology

## 2024-02-01 ENCOUNTER — Encounter (HOSPITAL_COMMUNITY)
Admission: RE | Admit: 2024-02-01 | Discharge: 2024-02-01 | Attending: Radiation Oncology | Admitting: Radiation Oncology

## 2024-02-01 DIAGNOSIS — Z510 Encounter for antineoplastic radiation therapy: Secondary | ICD-10-CM | POA: Insufficient documentation

## 2024-02-01 DIAGNOSIS — C210 Malignant neoplasm of anus, unspecified: Secondary | ICD-10-CM

## 2024-02-01 LAB — GLUCOSE, CAPILLARY: Glucose-Capillary: 117 mg/dL — ABNORMAL HIGH (ref 70–99)

## 2024-02-01 MED ORDER — FLUDEOXYGLUCOSE F - 18 (FDG) INJECTION
6.1000 | Freq: Once | INTRAVENOUS | Status: AC
  Administered 2024-02-01: 6.1 via INTRAVENOUS

## 2024-02-04 ENCOUNTER — Telehealth: Payer: Self-pay | Admitting: Radiation Oncology

## 2024-02-04 NOTE — Telephone Encounter (Signed)
 Pt called back and we discussed her PET scan and plan to continue on with plans of chemoRT startin 12/29.

## 2024-02-04 NOTE — Telephone Encounter (Signed)
 I called to review the patient's PET scan results. She was unavailable so I left a voicemail.

## 2024-02-06 ENCOUNTER — Encounter: Payer: Self-pay | Admitting: Physical Therapy

## 2024-02-06 ENCOUNTER — Ambulatory Visit: Attending: Radiation Oncology | Admitting: Physical Therapy

## 2024-02-06 ENCOUNTER — Encounter: Payer: Self-pay | Admitting: Oncology

## 2024-02-06 ENCOUNTER — Inpatient Hospital Stay: Admitting: Licensed Clinical Social Worker

## 2024-02-06 DIAGNOSIS — C210 Malignant neoplasm of anus, unspecified: Secondary | ICD-10-CM | POA: Insufficient documentation

## 2024-02-06 DIAGNOSIS — M6281 Muscle weakness (generalized): Secondary | ICD-10-CM | POA: Diagnosis present

## 2024-02-06 NOTE — Patient Instructions (Signed)
 Urge Incontinence  Ideal urination frequency is every 2-4 wakeful hours, which equates to 5-8 times within a 24-hour period.   Urge incontinence is leakage that occurs when the bladder muscle contracts, creating a sudden need to go before getting to the bathroom.   Going too often when your bladder isn't actually full can disrupt the body's automatic signals to store and hold urine longer, which will increase urgency/frequency.  In this case, the bladder "is running the show" and strategies can be learned to retrain this pattern.   One should be able to control the first urge to urinate, at around .  The bladder can hold up to a "grande latte," or . To help you gain control, practice the Urge Drill below when urgency strikes.  This drill will help retrain your bladder signals and allow you to store and hold urine longer.  The overall goal is to stretch out your time between voids to reach a more manageable voiding schedule.    Practice your "quick flicks" often throughout the day (each waking hour) even when you don't need feel the urge to go.  This will help strengthen your pelvic floor muscles, making them more effective in controlling leakage.  Urge Drill  When you feel an urge to go, follow these steps to regain control: Stop what you are doing and be still Take one deep breath, directing your air into your abdomen Think an affirming thought, such as "I've got this." Do 5 quick flicks of your pelvic floor Walk with control to the bathroom to void, or delay voiding  [facility] [address], [address] [place], [place] [address] Phone # [phone] Fax [phone]

## 2024-02-06 NOTE — Therapy (Signed)
 OUTPATIENT PHYSICAL THERAPY FEMALE PELVIC TREATMENT   Patient Name: Brandi Mitchell MRN: [id number] DOB:August 03, 1960, 63 y.o., female Today's Date: 02/06/2024  END OF SESSION:  PT End of Session - 02/06/24 1620     Visit Number 2    Date for Recertification  07/31/24    Authorization Type [employer]    PT Start Time 1620    PT Stop Time 1700    PT Time Calculation (min) 40 min    Activity Tolerance Patient tolerated treatment well    Behavior During Therapy [facility] for tasks assessed/performed          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   Heart murmur    I have had it all my life but no issues with it   Hypothyroidism    Migraine headache    Thyroid  disease    Past Surgical History:  Procedure Laterality Date   ABDOMINAL HYSTERECTOMY     BREAST EXCISIONAL BIOPSY Left    CESAREAN SECTION     FOOT SURGERY     HEMORRHOID SURGERY N/A 01/02/2024   Procedure: HEMORRHOIDECTOMY;  Surgeon: Mavis Anes, MD;  Location: [facility];  Service: General;  Laterality: N/A;   INGUINAL LYMPH NODE BIOPSY Left 01/02/2024   Procedure: BIOPSY, LYMPH NODE, INGUINAL, OPEN;  Surgeon: Mavis Anes, MD;  Location: [facility];  Service: General;  Laterality: Left;   TMJ ARTHROPLASTY     TONSILLECTOMY     TOTAL HIP ARTHROPLASTY Left 03/13/2017   Procedure: TOTAL HIP ARTHROPLASTY ANTERIOR APPROACH;  Surgeon: Sheril Coy, MD;  Location: [facility];  Service: Orthopedics;  Laterality: Left;   TOTAL HIP ARTHROPLASTY Right 10/30/2017   Procedure: TOTAL HIP ARTHROPLASTY ANTERIOR APPROACH;  Surgeon: Sheril Coy, MD;  Location: [facility];  Service: Orthopedics;  Laterality: Right;   TRIGGER FINGER RELEASE Left    Patient Active Problem List   Diagnosis Date Noted   Anal cancer (HCC) 01/28/2024   Lymph node enlargement 01/02/2024   Bleeding hemorrhoid 01/02/2024   Age-related osteoporosis without current pathological fracture  08/25/2021   Amnesia 08/25/2021   Arm paresthesia, left 08/25/2021   Carpal tunnel syndrome 08/25/2021   Chronic pain 08/25/2021   Disorder of female genital organs 08/25/2021   Essential hypertension 08/25/2021   History of adenomatous polyp of colon 08/25/2021   Hypothyroidism 08/25/2021   Migraine, unspecified, not intractable, without status migrainosus 08/25/2021   Osteopenia 08/25/2021   Osteoporosis 08/25/2021   Other specified nonpsychotic mental disorders 08/25/2021   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   Postmenopausal atrophic vaginitis 08/25/2021   Skin sensation disturbance 08/25/2021   Stress incontinence (female) (female) 08/25/2021   Primary osteoarthritis of right hip 10/30/2017   Primary osteoarthritis of left hip 03/13/2017    PCP: none  REFERRING PROVIDER: Lanell Donald Stagger, PA-C   REFERRING DIAG: C21.0 (ICD-10-CM) - Anal cancer (HCC)   THERAPY DIAG:  Muscle weakness (generalized)  Anal cancer (HCC)  Rationale for Evaluation and Treatment: Rehabilitation  ONSET DATE: 01/08/24  SUBJECTIVE:  SUBJECTIVE STATEMENT: Anal cancer diagnosed on 01/08/24 Pre radiation evaluation Pt to start radiation on 12/29 Has dilators already too! 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 Yes NPRS scale: 1/10 Pain location: Anal  Pain type: discomfort Pain description: intermittent   Aggravating factors: bowel movement Relieving factors: no  bowel movement  PRECAUTIONS: Other: anal cancer  RED FLAGS: None   WEIGHT BEARING RESTRICTIONS: No  FALLS:  Has patient fallen in last 6 months? 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    BOWEL MOVEMENT: Pain with bowel movement: Yes, discomfort Type of bowel movement:Type (Bristol Stool Scale) Type 4, Frequency daily, Strain no, and Splinting no Fully empty rectum: No Leakage: No                                                 Bowel urgency: no Pads: No Fiber supplement/laxative No  URINATION: Pain with urination: No Fully empty bladder: Yes:                                           Post-void dribble: No Stream: Strong Urgency: No Frequency:during the day every 2-3 hours                                                        Nocturia: No   Leakage: none Pads/briefs: No  INTERCOURSE:  Ability to have vaginal penetration Yes  Pain with intercourse: none Dryness: Yes  Climax: yes Marinoff Scale: 0/3  PREGNANCY: Vaginal deliveries 1 Tearing No Episiotomy No C-section deliveries 1 Currently pregnant No  PROLAPSE: None   OBJECTIVE:  Note: Objective measures were completed at Evaluation unless otherwise noted.  DIAGNOSTIC FINDINGS:  FINAL MICROSCOPIC DIAGNOSIS:  A. HEMORRHOID, HEMORRHOIDECTOMY: -  Invasive moderately differentiated squamous cell carcinoma (depth of invasion 5 mm -  An unoriented lateral margin is positive.   The deep margin negative but is less than 1 mm.  Note: Dr. Rebbecca has peer reviewed the case and agrees with the interpretation.  Initial results were discussed with Dr. Mavis on 01/03/2024  B. LYMPH NODE, LEFT INGUINAL, BIOPSY: -  Metastatic moderately differentiated squamous cell carcinoma.   PATIENT SURVEYS:  PFIQ-7: 0 UIQ-7 0 CRAIG -7  0 Female Sexual Function Index (FSFI) Questionnaire 0  COGNITION: Overall cognitive status: Within functional limits for tasks assessed     SENSATION: Light touch: Appears intact   FUNCTIONAL TESTS:  5 times sit to stand: 11 sec 6 minute walk test: 1745 feet Single leg stance:  Rt:no pelvic drop  Lt:no pelvic drop Sit-up test:able to go into sitting without arm support Squat:able to go fully down Bed mobility:  GAIT: Assistive device utilized: None  POSTURE: No Significant postural limitations   LUMBARAROM/PROM: Lumbar ROIM is full   LOWER EXTREMITY MNF:qloo bilateral hip ROM   LOWER EXTREMITY MMT:  MMT Right eval Left eval  Hip abduction 4/5 5/5   (Blank rows = not tested) PALPATION: Pelvic Alignment: ASIS  Abdominal: at first contract the abdominals  with hinging at the thoracic lumbar area but after she was taught to contract the abdominals with lower abdominals was able to sit up without difficulty  Diastasis: No Distortion: No                  External Perineal Exam: not assessed at this time                             Internal Pelvic Floor: not assessed at this time  Patient confirms identification and approves PT to assess internal pelvic floor and treatment No; Active cancer and recovering from Hemorrhoidectomy.  All internal or external pelvic floor assessments and/or treatments are completed with proper hand hygiene and gloves hands. If needed gloves are changed with hand hygiene during patient care time.  PELVIC MMT:   MMT eval  Vaginal   Internal Anal Sphincter   External Anal Sphincter   Puborectalis   (Blank rows = not tested)        TODAY'S TREATMENT:   02/06/24 Neuromuscular re-education: Down training: Diaphragmatic breathing in supine to relax the pelvic floor Self-care: Educated patient on the dilator and how to use it but will not use until 2 weeks after radiation.  Educated patient on what to expect with radiation and  changes she will see Educated patient on increased in loose stools, using depends, being near a bathroom, diet can change the stools Educated patient on how to do her yoga, light weight lifting, continuing with her regular exercise program                                                                                                                                DATE: 01/31/24  EVAL Examination completed, findings reviewed, pt educated on POC, HEP, and female pelvic floor anatomy, reasoning with pelvic floor assessment internally with pt consent, and abdominal massage. Pt motivated to participate in PT and agreeable to attempt recommendations.     PATIENT EDUCATION:  02/06/24 Education details: Access Code: [email or web address], rectal care, pelvic floor contraction, energy conservation, continues with her yoga and gym program Person educated: Patient Education method: Explanation, Demonstration, Tactile cues, Verbal cues, and Handouts Education comprehension: verbalized understanding, returned demonstration, verbal cues required, tactile cues required, and needs further education  HOME EXERCISE PROGRAM: 02/06/24 Access Code: [email or web address] URL: [email or web address] Date: 02/06/2024 Prepared by: Channing Pereyra  Program Notes use the dilators 2 weeks after radiation. If you have blisters and discomfort then ask for cream. You may have multiple bowel movements  so have depends .  drink plenty of fluids including electrolyte drinks. eat small meals to see how your respondssome people with use immodium for the loose stoolsyoga at home: yoga by Shelba on you tubeweight lifting doing the light weights   Exercises - Seated Pelvic Floor Contraction  - 3 x daily - 7 x weekly - 1 sets - 10 reps - Seated Quick Flick Pelvic Floor Contractions  - 3 x daily - 7 x weekly - 1 sets - 5 reps - Supine Diaphragmatic Breathing  - 1 x daily - 7 x weekly - 3 sets - 10 reps - Seated Diaphragmatic Breathing  - 1  x daily - 7 x weekly - 3 sets - 10 reps  ASSESSMENT:  CLINICAL IMPRESSION: Patient is a 63 y.o. female who was seen today for physical therapy evaluation and treatment for anal cancer. Patient was diagnosis  on 01/08/24. She will have 5 weeks fo radiation and chemothrapy starting on 02/18/24.  She is able to urinate without difficulty. She is having bowel movements regularly. She was given dilators to be used after she completes round of radiation. She exercises regularly at the gym and understands to continue to assist with the recovery after her treatments. She is able to walk  1745 feet in 6 minutes and sit to stand 11 seconds. She understands changes that may happen with radiation to the vaginal area and rectum. She understands the ways to continue with her yoga to keep her hip mobility and pelvic floor lengthening. Patient was educated on how to care for the rectal and perineal area during radiation. Patient was educated on how diet can change her stools. She understands she will have multiple stools and should have some depends. She will not use the vaginal dilators until she returns to therapy to be educated on how to use them. Patient will return to be reassessed after her treatments to see what deficits have begun and to work on them.   OBJECTIVE IMPAIRMENTS: decreased activity tolerance and decreased education for care of perineal area and overall health during her chemotherapy radiation treatments.   ACTIVITY LIMITATIONS: not at this time but can change after treatment.   PARTICIPATION LIMITATIONS: community activity  PERSONAL FACTORS: 1 comorbidity: anal cancer are also affecting patient's functional outcome.   REHAB POTENTIAL: Excellent  CLINICAL DECISION MAKING: Evolving/moderate complexity  EVALUATION COMPLEXITY: Moderate   GOALS: Goals reviewed with patient? Yes  SHORT TERM GOALS: Target date: 02/28/24  Patient educated on bowel health.  Baseline: Goal status: Met  02/01/24  2.  Patient educated on energy conservation for during her cancer treatments Baseline:  Goal status: Met 02/01/24  3.  Patient educated on continuing her exercise program to keep her energy and assist with recovery from the cancer treatments.  Baseline:  Goal status: Met 02/01/24   LONG TERM GOALS: Target date: 07/31/24  Patient independent with her advanced HEP.  Baseline:  Goal status: INITIAL  2.  Patient reassessed after her cancer treatments with new goals in place.  Baseline:  Goal status: INITIAL   PLAN:  PT FREQUENCY: 1x/week  PT DURATION: 6 months  PLANNED INTERVENTIONS: 97110-Therapeutic exercises, 97530- Therapeutic activity, 97112- Neuromuscular re-education, [phone]- Self Care, [id number]- Manual therapy, Patient/Family education, Balance training, Joint mobilization, Spinal mobilization, and Biofeedback  PLAN FOR NEXT SESSION: assess patient after cancer treatments; educate on use of dilators, make new goals   Channing Pereyra, PT 02/06/2024 4:58 PM

## 2024-02-06 NOTE — Progress Notes (Signed)
 START ON PATHWAY REGIMEN - Anal Carcinoma     One cycle, concurrent with RT:     Fluorouracil      Mitomycin   **Always confirm dose/schedule in your pharmacy ordering system**  Patient Characteristics: Anal Canal Tumors, Newly Diagnosed - Locoregional Disease (Clinical Staging) Therapeutic Status: Newly Diagnosed - Locoregional Disease (Clinical Staging) Check here if patient was staged using an edition other than AJCC Staging 9th Edition: false AJCC T Category: cT1 AJCC N Category: cN1a AJCC M Category: cM0 AJCC 9 Stage Grouping: IIB Intent of Therapy: Curative Intent, Discussed with Patient

## 2024-02-07 ENCOUNTER — Encounter: Admitting: Physical Therapy

## 2024-02-07 ENCOUNTER — Encounter: Payer: Self-pay | Admitting: Oncology

## 2024-02-07 NOTE — Progress Notes (Signed)
 [facility] Clinical Social Work  Initial Assessment   Brandi Mitchell is a 63 y.o. year old female contacted by phone. Clinical Social Work was referred by medical provider for assessment of psychosocial needs.   SDOH (Social Determinants of Health) assessments performed: Yes   SDOH Screenings   Housing: Unknown (01/21/2024)   Received from [facility]  Depression [phone]): Low Risk (01/23/2024)  Tobacco Use: Low Risk (02/06/2024)    PHQ 2/9:    01/23/2024    2:01 PM  Depression screen PHQ 2/9  Decreased Interest 0  Down, Depressed, Hopeless 0  PHQ - 2 Score 0     Distress Screen completed: No     No data to display            Family/Social Information:  Housing Arrangement: patient lives with her husband.   Family members/support persons in your life? Pt's husband will be her primary source of support.  Pt has children residing in [place] and reports a strong connection to her church community and friends who will also assist as needed.  Transportation concerns: no  Employment: Working part time in [employer] at [employer] in [place].  Pt's spouse works second shift at the [employer].  Income source: Employment Financial concerns: No Type of concern: None Food access concerns: no Religious or spiritual practice: Yes-  Advanced directives: No Services Currently in place:  none  Coping/ Adjustment to diagnosis: Patient understands treatment plan and what happens next? yes Concerns about diagnosis and/or treatment: Overwhelmed by information Patient reported stressors: Adjusting to my illness Hopes and/or priorities: pt's priority is to continue treatment w/ the hope of positive results. Patient enjoys time with family/ friends Current coping skills/ strengths: Capable of independent living , Manufacturing systems engineer , Contractor , Motivation for treatment/growth , Physical Health , and Supportive family/friends     SUMMARY: Current SDOH  Barriers:  No barriers identified at this time.   Clinical Social Work Clinical Goal(s):  No clinical social work goals at this time  Interventions: Discussed common feeling and emotions when being diagnosed with cancer, and the importance of support during treatment Informed patient of the support team roles and support services at [facility] Provided CSW contact information and encouraged patient to call with any questions or concerns   Follow Up Plan: Patient will contact CSW with any support or resource needs Patient verbalizes understanding of plan: Yes    Devere JONELLE Manna, LCSW Clinical Social Worker [facility]

## 2024-02-08 ENCOUNTER — Other Ambulatory Visit: Payer: Self-pay

## 2024-02-11 ENCOUNTER — Encounter (HOSPITAL_COMMUNITY): Payer: Self-pay

## 2024-02-11 ENCOUNTER — Ambulatory Visit: Admitting: Radiation Oncology

## 2024-02-12 ENCOUNTER — Ambulatory Visit

## 2024-02-12 ENCOUNTER — Other Ambulatory Visit: Payer: Self-pay

## 2024-02-12 NOTE — Progress Notes (Signed)
 Pharmacist Chemotherapy Monitoring - Initial Assessment    Anticipated start date: 02/18/24   The following has been reviewed per standard work regarding the patient's treatment regimen: The patient's diagnosis, treatment plan and drug doses, and organ/hematologic function Lab orders and baseline tests specific to treatment regimen  The treatment plan start date, drug sequencing, and pre-medications Prior authorization status  Patient's documented medication list, including drug-drug interaction screen and prescriptions for anti-emetics and supportive care specific to the treatment regimen The drug concentrations, fluid compatibility, administration routes, and timing of the medications to be used The patient's access for treatment and lifetime cumulative dose history, if applicable  The patient's medication allergies and previous infusion related reactions, if applicable   Changes made to treatment plan:  Added magnesium to labs and adjusted plan start day.  Follow up needed:  N/A   Brandi Mitchell Molt, [facility], 02/12/2024  4:07 PM

## 2024-02-12 NOTE — H&P (Signed)
 "    Chief Complaint: anal cancer  Referring Provider(s): Davonna Siad  Supervising Physician: Hughes Simmonds  Patient Status: [facility] - Out-pt  History of Present Illness: Brandi Mitchell is a 63 y.o. female who presents today for access for chemotherapy treatment of anal cancer. Initially, she had noticed bleeding hemorrhoids and was also found to have swelling of a left inguinal lymph node. CT abdomen pelvis with contrast on 11/24/23 revealed a 3.8 cm left inguinal lymph node. She recently had surgery for left inguinal lymph node excision along with internal and external simple hemorrhoidectomy on 01/02/2024. 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 Received request for image guided port a catheter placement for treatment.   Confirms NPO since MN and ride/supervision available for 24 hours. Denies fever, chills, SHOB, CP, sore throat, N/V, abd pain, blood in stool or urine, abnormal bruising, leg swelling, back pain.  She is aware of the goals of the procedure and is agreeable to proceed.   Allergies Reviewed:  Levothyroxine  sodium, Oxycodone hcl, Indomethacin, Keflex [cephalexin], Percocet [oxycodone-acetaminophen ], and Tramadol   Patient is Full Code  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   Heart murmur    I have had it all my life but no issues with it   Hypothyroidism    Migraine headache    Thyroid  disease     Past Surgical History:  Procedure Laterality Date   ABDOMINAL HYSTERECTOMY     BREAST EXCISIONAL BIOPSY Left    CESAREAN SECTION     FOOT SURGERY     HEMORRHOID SURGERY N/A 01/02/2024   Procedure: HEMORRHOIDECTOMY;  Surgeon: Mavis Anes, MD;  Location: [facility] ORS;  Service: General;  Laterality: N/A;   INGUINAL LYMPH NODE BIOPSY Left 01/02/2024   Procedure: BIOPSY, LYMPH NODE, INGUINAL, OPEN;  Surgeon: Mavis Anes, MD;  Location: [facility] ORS;  Service:  General;  Laterality: Left;   TMJ ARTHROPLASTY     TONSILLECTOMY     TOTAL HIP ARTHROPLASTY Left 03/13/2017   Procedure: TOTAL HIP ARTHROPLASTY ANTERIOR APPROACH;  Surgeon: Sheril Coy, MD;  Location: [facility] OR;  Service: Orthopedics;  Laterality: Left;   TOTAL HIP ARTHROPLASTY Right 10/30/2017   Procedure: TOTAL HIP ARTHROPLASTY ANTERIOR APPROACH;  Surgeon: Sheril Coy, MD;  Location: [facility] OR;  Service: Orthopedics;  Laterality: Right;   TRIGGER FINGER RELEASE Left       Medications: Prior to Admission medications  Medication Sig Start Date End Date Taking? Authorizing Provider  amLODipine (NORVASC) 5 MG tablet Take 5 mg by mouth daily.    [provider]  Calcium-Magnesium-Vitamin D (CITRACAL CALCIUM +D3) 600-40-500 MG-MG-UNIT TB24 Take 1 tablet by mouth daily at 12 noon.    [provider]  cyanocobalamin 1000 MCG tablet Take 1,000 mcg by mouth daily.    [provider]  levothyroxine  (SYNTHROID ) 88 MCG tablet Take 1 tablet by mouth daily. Brand name Synthroid     [provider]  Omega-3 Fatty Acids (FISH OIL CONCENTRATE) 1000 MG CAPS Take 1,000 mg by mouth in the morning.    [provider]  telmisartan (MICARDIS) 40 MG tablet Take 1 tablet by mouth every evening. 02/17/21   [provider]     Family History  Problem Relation Age of Onset   Skin cancer Mother    Brain cancer Father        dx 87s   Cancer - Other  Sister        anal dx 9s   Skin cancer Maternal Uncle    Skin cancer Maternal Grandmother    Lung cancer Maternal Grandfather     Social History   Socioeconomic History   Marital status: Married    Spouse name: Not on file   Number of children: Not on file   Years of education: Not on file   Highest education level: Not on file  Occupational History   Not on file  Tobacco Use   Smoking status: Never   Smokeless tobacco: Never  Vaping Use   Vaping status: Never Used  Substance and Sexual Activity    Alcohol use: No   Drug use: No   Sexual activity: Not on file  Other Topics Concern   Not on file  Social History Narrative   Right handed    Caffeine- cups daily   Lives with husband    Social Drivers of Health   Tobacco Use: Low Risk (02/06/2024)   Patient History    Smoking Tobacco Use: Never    Smokeless Tobacco Use: Never    Passive Exposure: Not on file  Financial Resource Strain: Not on file  Food Insecurity: Not on file  Transportation Needs: Not on file  Physical Activity: Not on file  Stress: Not on file  Social Connections: Not on file  Depression (PHQ2-9): Low Risk (01/23/2024)   Depression (PHQ2-9)    PHQ-2 Score: 0  Alcohol Screen: Not on file  Housing: Unknown (01/21/2024)   Received from [facility]   Epic    Unable to Pay for Housing in the Last Year: Not on file    Number of Times Moved in the Last Year: Not on file    At any time in the past 12 months, were you homeless or living in a shelter (including now)?: No  Utilities: Not on file  Health Literacy: Not on file     Review of Systems: A 12 point ROS discussed and pertinent positives are indicated in the HPI above.  All other systems are negative.    Vital Signs: There were no vitals taken for this visit.  Advance Care Plan: no documents on file   Physical Exam Vitals and nursing note reviewed.  Constitutional:      General: She is not in acute distress.    Appearance: Normal appearance. She is not ill-appearing.  Cardiovascular:     Rate and Rhythm: Normal rate and regular rhythm.  Pulmonary:     Effort: Pulmonary effort is normal. No respiratory distress.     Breath sounds: Normal breath sounds.  Abdominal:     General: Abdomen is flat.     Palpations: Abdomen is soft.  Skin:    General: Skin is warm and dry.  Neurological:     General: No focal deficit present.     Mental Status: She is alert and oriented to person, place, and time. Mental status is at baseline.      Imaging: NM PET Image Initial (PI) Skull Base To Thigh Result Date: 02/01/2024 EXAM: PET AND CT SKULL BASE TO MID THIGH 02/01/2024 09:04:32 AM TECHNIQUE: RADIOPHARMACEUTICAL: 6.1 mCi F-18 FDG Uptake time 60 minutes. Glucose level 117 mg/dl. PET imaging was acquired from the base of the skull to the mid thighs. Non-contrast enhanced computed tomography was obtained for attenuation correction and anatomic localization. COMPARISON: CT abdomen pelvis with contrast dated 11/24/2023. CLINICAL HISTORY: Anal cancer, staging. The patient had noticed  bleeding hemorrhoids and was found to have a left inguinal lymph node swelling. After a CT abdomen pelvis with contrast on 11/24/2023 showed a 3.8 cm left inguinal lymph node and she was referred to surgery and met with Dr. Mavis. She subsequently underwent internal and external simple hemorrhoidectomy and left inguinal lymph node excision on 01/02/2024. 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 FINDINGS: HEAD AND NECK: No metabolically active cervical lymphadenopathy. CHEST: No metabolically active pulmonary nodules. No metabolically active lymphadenopathy. ABDOMEN AND PELVIS: There is mild activity centrally within the anal canal with a SUV maximum of 5.6, which is not specific on imaging 168. There is significant streak artifact through the pelvis generated by the bilateral hip prosthetics. There is mild activity in the posterior deep left pelvis adjacent to the rectum with SUV maximum equal to 4.4 on image 161. There is a soft tissue at this site which may represent a lymph node in the left mesorectum. CT images are again severely degraded. A potential lymph node was identified on comparison contrast CT  measuring 8 mm on image 65 of series 2. There are no iliac lymph nodes identified. No hypermetabolic inguinal nodes. No evidence of metastatic disease outside of the pelvis. Physiologic activity within the gastrointestinal and genitourinary systems. BONES AND SOFT TISSUE: No metabolically active aggressive osseous lesion. IMPRESSION: 1. Mild hypermetabolism within the anal canal (SUV max 5.6) is  nonspecific 2. Mild focal uptake in the posterior deep left pelvis adjacent to the rectum (SUV max 4.4), suspicious for a left mesorectal lymph node given corresponding 8 mm node on prior contrast CT; evaluation limited by severe streak artifact from bilateral hip prostheses. 3. No hypermetabolic inguinal lymphadenopathy and no evidence of metastatic disease outside the pelvis. Electronically signed by: Norleen Boxer MD 02/01/2024 09:32 AM EST RP Workstation: [id number]   CT CHEST W CONTRAST Result Date: 01/26/2024 EXAM: CT CHEST WITH CONTRAST 01/24/2024 09:36:54 AM TECHNIQUE: CT of the chest was performed with the administration of 70 mL of iopamidol  (ISOVUE -370) 76% injection. Multiplanar reformatted images are provided for review. Automated exposure control, iterative reconstruction, and/or weight based adjustment of the mA/kV was utilized to reduce the radiation dose to as low as reasonably achievable. COMPARISON: No prior study available for comparison. CLINICAL HISTORY: FINDINGS: MEDIASTINUM: Normal heart size. No substantial pericardial effusion. Coronary artery atherosclerotic calcification evident. Mild atherosclerotic calcification is noted in the wall of the thoracic aorta. The central airways are clear. LYMPH NODES: No mediastinal, hilar or axillary lymphadenopathy. LUNGS AND PLEURA: Calcified granuloma noted in the right lower lobe. No suspicious pulmonary nodule or mass. No focal consolidation or pulmonary edema. No pleural effusion or pneumothorax. SOFT TISSUES/BONES: No acute abnormality of the bones or  soft tissues. UPPER ABDOMEN: Limited images of the upper abdomen demonstrate a tiny hypodensity anterior left liver, stable since abdomen CT of 01/24/2024, most likely benign, and technically too small to characterize. IMPRESSION: 1. No evidence for metastatic disease in the chest. Electronically signed by: Camellia Candle MD 01/26/2024 12:26 PM EST RP Workstation: [id number]    Labs:  CBC: Recent Labs    12/28/23 1117  WBC 5.3  HGB 12.8  HCT 39.2  PLT 302    COAGS: No results for input(s): INR, APTT in the last 8760 hours.  BMP: Recent Labs  11/24/23 0959 12/28/23 1117  NA  --  140  K  --  3.9  CL  --  103  CO2  --  27  GLUCOSE  --  102*  BUN  --  19  CALCIUM  --  8.8*  CREATININE 0.90 0.67  GFRNONAA  --  >60    LIVER FUNCTION TESTS: No results for input(s): BILITOT, AST, ALT, ALKPHOS, PROT, ALBUMIN in the last 8760 hours.  TUMOR MARKERS: No results for input(s): AFPTM, CEA, CA199, CHROMGRNA in the last 8760 hours.  Assessment and Plan: Anal cancer Request for image guided port a catheter placement. No contraindications for procedure identified in ROS, physical exam, or review of pre-sedation considerations.  Labs reviewed and within acceptable range Imaging available and reviewed VSS, afebrile   Risks and benefits of image guided port-a-catheter placement was discussed with the patient including, but not limited to bleeding, infection, pneumothorax, or fibrin sheath development and need for additional procedures.  All of the patient's questions were answered, patient is agreeable to proceed. Consent signed and in chart.   Thank you for allowing our service to participate in [facility] Dela Sweeny 's care.    Electronically Signed: Kristi B Davenport, NP   02/12/2024, 10:37 AM     I spent a total of 30 Minutes in face to face in clinical consultation, greater than 50% of which was counseling/coordinating care for image guided port a  catheter placement.    "

## 2024-02-13 ENCOUNTER — Ambulatory Visit

## 2024-02-13 DIAGNOSIS — Z510 Encounter for antineoplastic radiation therapy: Secondary | ICD-10-CM | POA: Diagnosis not present

## 2024-02-15 ENCOUNTER — Ambulatory Visit (HOSPITAL_COMMUNITY)
Admission: RE | Admit: 2024-02-15 | Discharge: 2024-02-15 | Disposition: A | Discharge status: Home / Self Care | Source: Ambulatory Visit | Attending: Oncology | Admitting: Oncology

## 2024-02-15 ENCOUNTER — Other Ambulatory Visit: Payer: Self-pay

## 2024-02-15 DIAGNOSIS — C210 Malignant neoplasm of anus, unspecified: Secondary | ICD-10-CM | POA: Insufficient documentation

## 2024-02-15 HISTORY — PX: IR IMAGING GUIDED PORT INSERTION: IMG5740

## 2024-02-15 MED ORDER — FENTANYL CITRATE (PF) 100 MCG/2ML IJ SOLN
INTRAMUSCULAR | Status: AC | PRN
  Administered 2024-02-15 (×2): 50 ug via INTRAVENOUS

## 2024-02-15 MED ORDER — SODIUM CHLORIDE 0.9 % IV SOLN: INTRAVENOUS | Status: DC

## 2024-02-15 MED ORDER — HEPARIN SOD (PORK) LOCK FLUSH 100 UNIT/ML IV SOLN
500.0000 [IU] | Freq: Once | INTRAVENOUS | Status: AC
  Administered 2024-02-15: 500 [IU] via INTRAVENOUS

## 2024-02-15 MED ORDER — MIDAZOLAM HCL (PF) 2 MG/2ML IJ SOLN
INTRAMUSCULAR | Status: AC | PRN
  Administered 2024-02-15 (×2): 1 mg via INTRAVENOUS

## 2024-02-15 MED ORDER — LIDOCAINE-EPINEPHRINE 1 %-1:100000 IJ SOLN
20.0000 mL | Freq: Once | INTRAMUSCULAR | Status: AC
  Administered 2024-02-15: 10 mL

## 2024-02-15 MED ORDER — HEPARIN SOD (PORK) LOCK FLUSH 100 UNIT/ML IV SOLN
INTRAVENOUS | Status: AC
  Filled 2024-02-15: qty 5

## 2024-02-15 MED ORDER — MIDAZOLAM HCL 2 MG/2ML IJ SOLN
INTRAMUSCULAR | Status: AC
  Filled 2024-02-15: qty 2

## 2024-02-15 MED ORDER — LIDOCAINE-EPINEPHRINE 1 %-1:100000 IJ SOLN
INTRAMUSCULAR | Status: AC
  Filled 2024-02-15: qty 1

## 2024-02-15 MED ORDER — FENTANYL CITRATE (PF) 100 MCG/2ML IJ SOLN
INTRAMUSCULAR | Status: AC
  Filled 2024-02-15: qty 2

## 2024-02-15 NOTE — Progress Notes (Signed)
 Discharge instructions reviewed with patient and spouse at bedside. Denies questions concerns. Incision site remains clean dry and intact. No s/s of complications. PT escorted from the unit via wheel chair to personal vehicle.

## 2024-02-15 NOTE — Discharge Instructions (Signed)
 SABRA

## 2024-02-15 NOTE — Procedures (Signed)
 Vascular and Interventional Radiology Procedure Note  Patient: Brandi Mitchell DOB: 30-Jun-1960 Medical Record Number: [id number] Note Date/Time: 02/15/2024 12:31 PM   Performing Physician: Thom Hall, MD Assistant(s): None  Diagnosis: Anal CA  Procedure: PORT PLACEMENT  Anesthesia: Conscious Sedation Complications: None Estimated Blood Loss: Minimal  Findings:  Successful right-sided port placement, with the tip of the catheter in the proximal right atrium.  Plan: Catheter ready for use.  See detailed procedure note with images in PACS. The patient tolerated the procedure well without incident or complication and was returned to Recovery in stable condition.    Thom Hall, MD [facility] Radiology   Pager. [phone] Clinic. [phone]

## 2024-02-17 NOTE — Patient Instructions (Signed)
 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 You will be treated with concurrent chemotherapy and radiation therapy. The intent of treatment is to cure your cancer. You will see the doctor regularly throughout treatment.  We will obtain blood work from you prior to every treatment and monitor your results to make sure it is safe to give your treatment. The doctor monitors your response to treatment by the way you are feeling, your blood work, and by obtaining scans periodically.  There will be wait times while you are here for treatment.  It will take about 30 minutes to 1 hour for your lab work to result.  Then there will be wait times while pharmacy mixes your medications.     Mitomycin    About This Drug   Mitomycin  is used to treat cancer. It is given in the vein (IV).   Possible Side Effects   Bone marrow suppression. This is a decrease in the number of white blood cells, red blood cells, and platelets. This may raise your risk of infection, make you tired and weak, and raise your risk of bleeding.    Fever    Soreness of the mouth and throat. You may have red areas, white patches, or sores that hurt.    Nausea and vomiting (throwing up)    Decreased appetite (decreased hunger)    Skin and tissue irritation including redness, pain, warmth, or swelling at the IV site if the drug leaks out of the vein and into nearby tissue. Very rarely it may cause local tissue necrosis (death).    Hair loss. Hair loss is often temporary, although with certain medicine, hair loss can sometimes be permanent. Hair loss may happen suddenly or gradually. If you lose hair, you may lose it from your head, face, armpits, pubic area, chest, and/or legs. You may also notice your hair getting thin. Note: Not all possible side effects are included above.   Warnings and Precautions   Severe bone marrow suppression, which can be life-threatening.    Changes  in your kidney function    A syndrome that affects your red blood cells, platelets and blood vessels in your kidneys, which can cause kidney failure and be life-threatening.    Inflammation (swelling) of the lungs. You may have a dry cough or trouble breathing.   Note: Some of the side effects above are very rare. If you have concerns and/or questions, please discuss them with your medical team.   Important Information    This drug may be present in the saliva, tears, sweat, urine, stool, vomit, semen, and vaginal secretions. Talk to your doctor and/or your nurse about the necessary precautions to take during this time.   Treating Side Effects   Manage tiredness by pacing your activities for the day.    Be sure to include periods of rest between energy-draining activities.    To decrease the risk of infection, wash your hands regularly.    Avoid close contact with people who have a cold, the flu, or other infections.    Take your temperature as your doctor or nurse tells you, and whenever you feel like you may have a fever.    To help decrease the risk of bleeding, use a soft toothbrush. Check with your nurse before using dental floss.    Be very careful when using knives or tools.  Use an electric shaver instead of a razor.    Mouth care  is very important. Your mouth care should consist of routine, gentle cleaning of your teeth or dentures and rinsing your mouth with a mixture of 1/2 teaspoon of salt in 8 ounces of water or 1/2 teaspoon of baking soda in 8 ounces of water. This should be done at least after each meal and at bedtime.    If you have mouth sores, avoid mouthwash that has alcohol. Also avoid alcohol and smoking because they can bother your mouth and throat.  Drink plenty of fluids (a minimum of eight glasses per day is recommended). More may be recommended by your doctor.    If you throw up, you should drink more fluids so that you do not become dehydrated (lack of water  in the body from losing too much fluid).    To help with nausea and vomiting, eat small, frequent meals instead of three large meals a day. Choose foods and drinks that are at room temperature. Ask your nurse or doctor about other helpful tips and medicine that is available to help stop or lessen these symptoms.    To help with decreased appetite, eat small, frequent meals. Eat foods high in calories and protein, such as meat, poultry, fish, dry beans, tofu, eggs, nuts, milk, yogurt, cheese, ice cream, pudding, and nutritional supplements.    Consider using sauces and spices to increase taste. Daily exercise, with your doctors approval, may increase your appetite.    To help with hair loss, wash with a mild shampoo and avoid washing your hair every day. Avoid coloring your hair.    Avoid rubbing your scalp, pat your hair or scalp dry.    Limit your use of hair spray, electric curlers, blow dryers, and curling irons.    If you are interested in getting a wig, talk to your nurse and they can help you get in touch with programs in your local area.   Food and Drug Interactions   There are no known interactions of mitomycin  with food.     This drug may interact with other medicines. Tell your doctor and pharmacist about all the prescription and over-the-counter medicines and dietary supplements (vitamins, minerals, herbs, and others) that you are taking at this time. Also, check with your doctor or pharmacist before starting any new prescription or over-the-counter medicines, or dietary supplements to make sure that there are no interactions.   When to Call the Doctor  Call your doctor or nurse if you have any of these symptoms and/or any new or unusual symptoms:    Fever of 100.4 F (38 C) or higher    Chills    Tiredness that interferes with your daily activities    Feeling dizzy or lightheaded    Easy bleeding or bruising    Wheezing and/or trouble breathing    Pain in your chest     Dry cough    Pain in your mouth or throat that makes it hard to eat or drink    Nausea that stops you from eating or drinking and/or is not relieved by prescribed medicines    Throwing up more than 3 times a day    Lasting loss of appetite or rapid weight loss of five pounds in a week    Swelling of the face, hands, feet, or any other part of the body    Decreased or very dark urine    While you are getting this drug, please tell your nurse right away if you have any pain, redness,  or swelling at the site of the IV infusion.    If you think you may be pregnant   Reproduction Warnings    Pregnancy warning: It is not known if this drug may harm an unborn child. For this reason, be sure to talk with your doctor if you are pregnant or planning to become pregnant while receiving this drug. Let your doctor know right away if you think you may be pregnant.    Breastfeeding warning: It is not known if this drug passes into breast milk. For this reason, women should talk to their doctor about the risks and benefits of breastfeeding during treatment with this drug because this drug may enter the breast milk and cause harm to a breastfeeding baby.    Fertility warning: Fertility studies have not been done with this drug. Talk with your doctor or nurse if you plan to have children. Ask for information on sperm or egg banking.    5-Fluorouracil  (Adrucil ; 5FU)  About This Drug  Fluorouracil  is used to treat cancer. It is given in the vein (IV). It is given as an IV push from a syringe and also as a continuous infusion given via an ambulatory pump (a pump you take home and wear for a specified amount of time).  Possible Side Effects   Bone marrow suppression. This is a decrease in the number of white blood cells, red blood cells, and platelets. This may raise your risk of infection, make you tired and weak (fatigue), and raise your risk of bleeding   Changes in the tissue of the heart  and/or heart attack. Some changes may happen that can cause your heart to have less ability to pump blood.   Blurred vision or other changes in eyesight   Nausea and throwing up (vomiting)   Diarrhea (loose bowel movements)   Ulcers - sores that may cause pain or bleeding in your digestive tract, which includes your mouth, esophagus, stomach, small/large intestines and rectum   Soreness of the mouth and throat. You may have red areas, white patches, or sores that hurt.   Allergic reactions, including anaphylaxis are rare but may happen in some patients. Signs of allergic reaction to this drug may be swelling of the face, feeling like your tongue or throat are swelling, trouble breathing, rash, itching, fever, chills, feeling dizzy, and/or feeling that your heart is beating in a fast or not normal way. If this happens, do not take another dose of this drug. You should get urgent medical treatment.   Sensitivity to light (photosensitivity). Photosensitivity means that you may become more sensitive to the sun and/or light. You may get a skin rash/reaction if you are in the sun or are exposed to sun lamps and tanning beds. Your eyes may water more, mostly in bright light.   Changes in your nail color, nail loss and/or brittle nail   Darkening of the skin, or changes to the color of your skin and/or veins used for infusion   Rash, dry skin, or itching  Note: Not all possible side effects are included above.  Warnings and Precautions   Hand-and-foot syndrome. The palms of your hands or soles of your feet may tingle, become numb, painful, swollen, or red.   Changes in your central nervous system can happen. The central nervous system is made up of your brain and spinal cord. You could feel extreme tiredness, agitation, confusion, hallucinations (see or hear things that are not there), trouble understanding or speaking, loss  of control of your bowels or bladder, eyesight changes, numbness or lack  of strength to your arms, legs, face, or body, or coma. If you start to have any of these symptoms let your doctor know right away.   Side effects of this drug may be unexpectedly severe in some patients  Note: Some of the side effects above are very rare. If you have concerns and/or questions, please discuss them with your medical team.   Important Information   This drug may be present in the saliva, tears, sweat, urine, stool, vomit, semen, and vaginal secretions. Talk to your doctor and/or your nurse about the necessary precautions to take during this time.   Treating Side Effects   Manage tiredness by pacing your activities for the day.   Be sure to include periods of rest between energy-draining activities.   To help decrease the risk of infections, wash your hands regularly.   Avoid close contact with people who have a cold, the flu, or other infections.   Take your temperature as your doctor or nurse tells you, and whenever you feel like you may have a fever.   Use a soft toothbrush. Check with your nurse before using dental floss.   Be very careful when using knives or tools.   Use an electric shaver instead of a razor.   If you have a nose bleed, sit with your head tipped slightly forward. Apply pressure by lightly pinching the bridge of your nose between your thumb and forefinger. Call your doctor if you feel dizzy or faint or if the bleeding doesnt stop after 10 to 15 minutes.   Drink plenty of fluids (a minimum of eight glasses per day is recommended).   If you throw up or have loose bowel movements, you should drink more fluids so that you do not  become dehydrated (lack of water in the body from losing too much fluid).   To help with nausea and vomiting, eat small, frequent meals instead of three large meals a day. Choose foods and drinks that are at room temperature. Ask your nurse or doctor about other helpful tips and medicine that is available to help, stop,  or lessen these symptoms.   If you have diarrhea, eat low-fiber foods that are high in protein and calories and avoid foods that can irritate your digestive tracts or lead to cramping.   Ask your nurse or doctor about medicine that can lessen or stop your diarrhea.   Mouth care is very important. Your mouth care should consist of routine, gentle cleaning of your teeth or dentures and rinsing your mouth with a mixture of 1/2 teaspoon of salt in 8 ounces of water or 1/2 teaspoon of baking soda in 8 ounces of water. This should be done at least after each meal and at bedtime.   If you have mouth sores, avoid mouthwash that has alcohol. Also avoid alcohol and smoking because they can bother your mouth and throat.   Keeping your nails moisturized may help with brittleness.   To help with itching, moisturize your skin several times day.   Use sunscreen with SPF 30 or higher when you are outdoors even for a short time. Cover up when you are out in the sun. Wear wide-brimmed hats, long-sleeved shirts, and pants. Keep your neck, chest, and back covered. Wear dark sun glasses when in the sun or bright lights.   If you get a rash do not put anything on it unless your doctor  or nurse says you may. Keep the area around the rash clean and dry. Ask your doctor for medicine if your rash bothers you.   Keeping your pain under control is important to your well-being. Please tell your doctor or nurse if you are experiencing pain.   Food and Drug Interactions   There are no known interactions of fluorouracil  with food.   Check with your doctor or pharmacist about all other prescription medicines and over-the-counter medicines and dietary supplements (vitamins, minerals, herbs and others) you are taking before starting this medicine as there are known drug interactions with 5-fluoroucacil. Also, check with your doctor or pharmacist before starting any new prescription or over-the-counter medicines, or dietary  supplements to make sure that there are no interactions.  When to Call the Doctor  Call your doctor or nurse if you have any of these symptoms and/or any new or unusual symptoms:   Fever of 100.4 F (38 C) or higher   Chills   Easy bleeding or bruising   Nose bleed that doesnt stop bleeding after 10-15 minutes   Trouble breathing   Feeling dizzy or lightheaded   Feeling that your heart is beating in a fast or not normal way (palpitations)   Chest pain or symptoms of a heart attack. Most heart attacks involve pain in the center of the chest that lasts more than a few minutes. The pain may go away and come back or it can be constant. It can feel like pressure, squeezing, fullness, or pain. Sometimes pain is felt in one or both arms, the back, neck, jaw, or stomach. If any of these symptoms last 2 minutes, call 911.   Confusion and/or agitation   Hallucinations   Trouble understanding or speaking   Loss of control of bowels or bladder   Blurry vision or changes in your eyesight   Headache that does not go away   Numbness or lack of strength to your arms, legs, face, or body   Nausea that stops you from eating or drinking and/or is not relieved by prescribed medicines   Throwing up more than 3 times a day   Diarrhea, 4 times in one day or diarrhea with lack of strength or a feeling of being dizzy   Pain in your mouth or throat that makes it hard to eat or drink   Pain along the digestive tract - especially if worse after eating   Blood in your vomit (bright red or coffee-ground) and/or stools (bright red, or black/tarry)   Coughing up blood   Tiredness that interferes with your daily activities   Painful, red, or swollen areas on your hands or feet or around your nails   A new rash or a rash that is not relieved by prescribed medicines   Develop sensitivity to sunlight/light   Numbness and/or tingling of your hands and/or feet   Signs of allergic reaction:  swelling of the face, feeling like your tongue or throat are swelling, trouble breathing, rash, itching, fever, chills, feeling dizzy, and/or feeling that your heart is beating in a fast or not normal way. If this happens, call 911 for emergency care.   If you think you are pregnant or may have impregnated your partner  Reproduction Warnings   Pregnancy warning: This drug may have harmful effects on the unborn baby. Women of child bearing potential should use effective methods of birth control during your cancer treatment and 3 months after treatment. Men with female partners of childbearing  potential should use effective methods of birth control during your cancer treatment and for 3 months after your cancer treatment. Let your doctor know right away if you think you may be pregnant or may have impregnated your partner.   Breastfeeding warning: It is not known if this drug passes into breast milk. For this reason, Women should not breastfeed during treatment because this drug could enter the breast milk and cause harm to a breastfeeding baby.   Fertility warning: In men and women both, this drug may affect your ability to have children in the future. Talk with your doctor or nurse if you plan to have children. Ask for information on sperm or egg banking.   SELF CARE ACTIVITIES WHILE ON CHEMOTHERAPY/IMMUNOTHERAPY:  Hydration Increase your fluid intake 48 hours prior to treatment and drink at least 8 to 12 cups (64 ounces) of water/decaffeinated beverages per day after treatment. You can still have your cup of coffee or soda but these beverages do not count as part of your 8 to 12 cups that you need to drink daily. No alcohol intake.  Medications Continue taking your normal prescription medication as prescribed.  If you start any new herbal or new supplements please let us  know first to make sure it is safe.  Mouth Care Have teeth cleaned professionally before starting treatment. Keep dentures  and partial plates clean. Use soft toothbrush and do not use mouthwashes that contain alcohol. Biotene is a good mouthwash that is available at most pharmacies or may be ordered by calling [phone]. Use warm salt water gargles (1 teaspoon salt per 1 quart warm water) before and after meals and at bedtime. Or you may rinse with 2 tablespoons of three-percent hydrogen peroxide mixed in eight ounces of water. If you are still having problems with your mouth or sores in your mouth please call the clinic. If you need dental work, please let the doctor know before you go for your appointment so that we can coordinate the best possible time for you in regards to your chemo regimen. You need to also let your dentist know that you are actively taking chemo. We may need to do labs prior to your dental appointment.  Skin Care Always use sunscreen that has not expired and with SPF (Sun Protection Factor) of 50 or higher. Wear hats to protect your head from the sun. Remember to use sunscreen on your hands, ears, face, & feet.  Use good moisturizing lotions such as udder cream, eucerin, or even Vaseline. Some chemotherapies can cause dry skin, color changes in your skin and nails.    Avoid long, hot showers or baths. Use gentle, fragrance-free soaps and laundry detergent. Use moisturizers, preferably creams or ointments rather than lotions because the thicker consistency is better at preventing skin dehydration. Apply the cream or ointment within 15 minutes of showering. Reapply moisturizer at night, and moisturize your hands every time after you wash them.   Infection Prevention Please wash your hands for at least 30 seconds using warm soapy water. Handwashing is the #1 way to prevent the spread of germs. Stay away from sick people or people who are getting over a cold. If you develop respiratory systems such as green/yellow mucus production or productive cough or persistent cough let us  know and we will see if  you need an antibiotic. It is a good idea to keep a pair of gloves on when going into grocery stores/[employer] to decrease your risk of coming into contact with  germs on the carts, etc. Carry alcohol hand gel with you at all times and use it frequently if out in public. If your temperature reaches 100.5 or higher please call the clinic and let us  know.  If it is after hours or on the weekend please go to the ER if your temperature is over 100.4.  Please have your own personal thermometer at home to use.    Sex and bodily fluids If you are going to have sex, a condom must be used to protect the person that isn't taking immunotherapy. For a few days after treatment, immunotherapy can be excreted through your bodily fluids.  When using the toilet please close the lid and flush the toilet twice.  Do this for a few day after you have had immunotherapy.   Contraception It is not known for sure whether or not immunotherapy drugs can be passed on through semen or secretions from the vagina. Because of this some doctors advise people to use a barrier method if you have sex during treatment. This applies to vaginal, anal or oral sex.  Generally, doctors advise a barrier method only for the time you are actually having the treatment and for about a week after your treatment.  Advice like this can be worrying, but this does not mean that you have to avoid being intimate with your partner. You can still have close contact with your partner and continue to enjoy sex.  Animals If you have cats or birds we just ask that you not change the litter or change the cage.  Please have someone else do this for you while you are on immunotherapy.   Food Safety During and After Cancer Treatment Food safety is important for people both during and after cancer treatment. Cancer and cancer treatments, such as chemotherapy, radiation therapy, and stem cell/bone marrow transplantation, often weaken the immune system. This makes it  harder for your body to protect itself from foodborne illness, also called food poisoning. Foodborne illness is caused by eating food that contains harmful bacteria, parasites, or viruses.  Foods to avoid Some foods have a higher risk of becoming tainted with bacteria. These include: Unwashed fresh fruit and vegetables, especially leafy vegetables that can hide dirt and other contaminants Raw sprouts, such as alfalfa sprouts Raw or undercooked beef, especially ground beef, or other raw or undercooked meat and poultry Fatty, fried, or spicy foods immediately before or after treatment.  These can sit heavy on your stomach and make you feel nauseous. Raw or undercooked shellfish, such as oysters. Sushi and sashimi, which often contain raw fish.  Unpasteurized beverages, such as unpasteurized fruit juices, raw milk, raw yogurt, or cider Undercooked eggs, such as soft boiled, over easy, and poached; raw, unpasteurized eggs; or foods made with raw egg, such as homemade raw cookie dough and homemade mayonnaise  Simple steps for food safety  Shop smart. Do not buy food stored or displayed in an unclean area. Do not buy bruised or damaged fruits or vegetables. Do not buy cans that have cracks, dents, or bulges. Pick up foods that can spoil at the end of your shopping trip and store them in a cooler on the way home.  Prepare and clean up foods carefully. Rinse all fresh fruits and vegetables under running water, and dry them with a clean towel or paper towel. Clean the top of cans before opening them. After preparing food, wash your hands for 20 seconds with hot water and soap. Pay special  attention to areas between fingers and under nails. Clean your utensils and dishes with hot water and soap. Disinfect your kitchen and cutting boards using 1 teaspoon of liquid, unscented bleach mixed into 1 quart of water.    Dispose of old food. Eat canned and packaged food before its expiration date (the use  by or best before date). Consume refrigerated leftovers within 3 to 4 days. After that time, throw out the food. Even if the food does not smell or look spoiled, it still may be unsafe. Some bacteria, such as Listeria, can grow even on foods stored in the refrigerator if they are kept for too long.  Take precautions when eating out. At restaurants, avoid buffets and salad bars where food sits out for a long time and comes in contact with many people. Food can become contaminated when someone with a virus, often a norovirus, or another bug handles it. Put any leftover food in a to-go container yourself, rather than having the server do it. And, refrigerate leftovers as soon as you get home. Choose restaurants that are clean and that are willing to prepare your food as you order it cooked.    SYMPTOMS TO REPORT AS SOON AS POSSIBLE AFTER TREATMENT:  FEVER GREATER THAN 100.4 F CHILLS WITH OR WITHOUT FEVER NAUSEA AND VOMITING THAT IS NOT CONTROLLED WITH YOUR NAUSEA MEDICATION UNUSUAL SHORTNESS OF BREATH UNUSUAL BRUISING OR BLEEDING TENDERNESS IN MOUTH AND THROAT WITH OR WITHOUT PRESENCE OF ULCERS URINARY PROBLEMS BOWEL PROBLEMS UNUSUAL RASH     Wear comfortable clothing and clothing appropriate for easy access to any Portacath or PICC line. Let us  know if there is anything that we can do to make your therapy better!   What to do if you need assistance after hours or on the weekends: CALL [phone].  HOLD on the line, do not hang up.  You will hear multiple messages but at the end you will be connected with a nurse triage line.  They will contact the doctor if necessary.  Most of the time they will be able to assist you.  Do not call the hospital operator.    I have been informed and understand all of the instructions given to me and have received a copy. I have been instructed to call the clinic [phone] or my family physician as soon as possible for continued medical care,  if indicated. I do not have any more questions at this time but understand that I may call the Cancer Center or the Patient Navigator at [phone] during office hours should I have questions or need assistance in obtaining follow-up care.

## 2024-02-18 ENCOUNTER — Inpatient Hospital Stay

## 2024-02-18 ENCOUNTER — Encounter: Payer: Self-pay | Admitting: *Deleted

## 2024-02-18 ENCOUNTER — Ambulatory Visit
Admission: RE | Admit: 2024-02-18 | Discharge: 2024-02-18 | Disposition: A | Discharge status: Home / Self Care | Source: Ambulatory Visit | Attending: Radiation Oncology | Admitting: Radiation Oncology

## 2024-02-18 ENCOUNTER — Other Ambulatory Visit: Payer: Self-pay

## 2024-02-18 ENCOUNTER — Inpatient Hospital Stay: Admitting: Dietician

## 2024-02-18 DIAGNOSIS — C210 Malignant neoplasm of anus, unspecified: Secondary | ICD-10-CM

## 2024-02-18 DIAGNOSIS — Z510 Encounter for antineoplastic radiation therapy: Secondary | ICD-10-CM | POA: Diagnosis not present

## 2024-02-18 LAB — CBC WITH DIFFERENTIAL/PLATELET
Abs Immature Granulocytes: 0.01 K/uL (ref 0.00–0.07)
Basophils Absolute: 0 K/uL (ref 0.0–0.1)
Basophils Relative: 1 %
Eosinophils Absolute: 0 K/uL (ref 0.0–0.5)
Eosinophils Relative: 1 %
HCT: 39.4 % (ref 36.0–46.0)
Hemoglobin: 12.8 g/dL (ref 12.0–15.0)
Immature Granulocytes: 0 %
Lymphocytes Relative: 19 %
Lymphs Abs: 1 K/uL (ref 0.7–4.0)
MCH: 28.2 pg (ref 26.0–34.0)
MCHC: 32.5 g/dL (ref 30.0–36.0)
MCV: 86.8 fL (ref 80.0–100.0)
Monocytes Absolute: 0.4 K/uL (ref 0.1–1.0)
Monocytes Relative: 9 %
Neutro Abs: 3.5 K/uL (ref 1.7–7.7)
Neutrophils Relative %: 70 %
Platelets: 295 K/uL (ref 150–400)
RBC: 4.54 MIL/uL (ref 3.87–5.11)
RDW: 13.2 % (ref 11.5–15.5)
WBC: 5 K/uL (ref 4.0–10.5)
nRBC: 0 % (ref 0.0–0.2)

## 2024-02-18 LAB — COMPREHENSIVE METABOLIC PANEL WITH GFR
ALT: 9 U/L (ref 0–44)
AST: 24 U/L (ref 15–41)
Albumin: 4.2 g/dL (ref 3.5–5.0)
Alkaline Phosphatase: 90 U/L (ref 38–126)
Anion gap: 14 (ref 5–15)
BUN: 13 mg/dL (ref 8–23)
CO2: 22 mmol/L (ref 22–32)
Calcium: 9 mg/dL (ref 8.9–10.3)
Chloride: 104 mmol/L (ref 98–111)
Creatinine, Ser: 0.66 mg/dL (ref 0.44–1.00)
GFR, Estimated: >60 mL/min
Glucose, Bld: 151 mg/dL — ABNORMAL HIGH (ref 70–99)
Potassium: 3.9 mmol/L (ref 3.5–5.1)
Sodium: 139 mmol/L (ref 135–145)
Total Bilirubin: 0.3 mg/dL (ref 0.0–1.2)
Total Protein: 6.8 g/dL (ref 6.5–8.1)

## 2024-02-18 LAB — RAD ONC ARIA SESSION SUMMARY
Course Elapsed Days: 0
Plan Fractions Treated to Date: 1
Plan Prescribed Dose Per Fraction: 1.8 Gy
Plan Total Fractions Prescribed: 30
Plan Total Prescribed Dose: 54 Gy
Reference Point Dosage Given to Date: 1.8 Gy
Reference Point Session Dosage Given: 1.8 Gy
Session Number: 1

## 2024-02-18 LAB — MAGNESIUM: Magnesium: 2.1 mg/dL (ref 1.7–2.4)

## 2024-02-18 MED ORDER — MITOMYCIN CHEMO IV INJECTION 20 MG
10.0000 mg/m2 | Freq: Once | INTRAVENOUS | Status: AC
  Administered 2024-02-18: 15.5 mg via INTRAVENOUS
  Filled 2024-02-18: qty 31

## 2024-02-18 MED ORDER — SODIUM CHLORIDE 0.9 % IV SOLN: INTRAVENOUS | Status: DC

## 2024-02-18 MED ORDER — PROCHLORPERAZINE MALEATE 10 MG PO TABS
10.0000 mg | ORAL_TABLET | Freq: Once | ORAL | Status: AC
  Administered 2024-02-18: 10 mg via ORAL
  Filled 2024-02-18: qty 1

## 2024-02-18 MED ORDER — LIDOCAINE-PRILOCAINE 2.5-2.5 % EX CREA: TOPICAL_CREAM | CUTANEOUS | 3 refills | Status: AC

## 2024-02-18 MED ORDER — SODIUM CHLORIDE 0.9 % IV SOLN
1000.0000 mg/m2/d | INTRAVENOUS | Status: DC
  Administered 2024-02-18: 6200 mg via INTRAVENOUS
  Filled 2024-02-18: qty 124

## 2024-02-18 MED ORDER — PROCHLORPERAZINE MALEATE 10 MG PO TABS: 10.0000 mg | ORAL_TABLET | Freq: Four times a day (QID) | ORAL | 1 refills | Status: AC | PRN

## 2024-02-18 MED ORDER — ONDANSETRON HCL 8 MG PO TABS: 8.0000 mg | ORAL_TABLET | Freq: Three times a day (TID) | ORAL | 1 refills | Status: AC | PRN

## 2024-02-18 NOTE — Progress Notes (Signed)

## 2024-02-18 NOTE — Progress Notes (Signed)
 Nutrition Assessment   Reason for Assessment: Referral - anal cancer    ASSESSMENT: 63 year old female with anal cancer. S/p biopsy + left hemorrhoidectomy 11/12 under the care of Dr. Mavis. She is starting concurrent chemoradiation with mitomycin  q32d (first chemo/RT today). Patient is under the care of Dr. Davonna  Past medical history includes HTN, migraine, bleeding hemorrhoid, hypothyroidism, carpal tunnel syndrome, osteoarthritis of hip, osteopenia  Met with patient in infusion. Husband is present for visit. She is doing well today. Reports bagel with cream cheese during the work week for breakfast. Enjoys bacon, eggs on the weekend. Patient is more of a snacker through out the day and will eat dinner meal. She drinks half gallon or more of water daily. 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 Works out 5 days per week, which she would like to continue as able during treatment. Patient having regular daily bowel movements.   Nutrition Focused Physical Exam: deferred    Medications: amlodipine, citracal, B12, synthroid , fish oil, zofran , compazine , micardis   Labs: glucose 151   Anthropometrics:   Height: 5'1 Weight: 117 lb  UBW: 125 lb  BMI: 22.11   NUTRITION DIAGNOSIS: Food and nutrition related knowledge deficit related to cancer as evidenced by no prior need for associated nutrition information    INTERVENTION:  Educated on importance of adequate calorie and protein energy intake to minimize loss of LBM during treatment - high calorie high protein snack ideas provided  Continue regular diet - discussed rationale for low fiber diet with progression of radiation therapy - handout provided  Discussed food safety - handout provided Continue activity as able  Contact information   MONITORING, EVALUATION, GOAL: wt trends, intake    Next Visit: Monday January 26 during infusion

## 2024-02-18 NOTE — Progress Notes (Signed)
 Chemotherapy education packet given and discussed with pt and family in detail.  Discussed diagnosis and staging, tx regimen, and intent of tx.  Reviewed chemotherapy medications and side effects, as well as pre-medications.  Instructed on how to manage side effects at home, and when to call the clinic.  Importance of fever/chills discussed with pt and family. Discussed precautions to implement at home after receiving tx, as well as self care strategies. Phone numbers provided for clinic during regular working hours, also how to reach the clinic after hours and on weekends. Pt and family provided the opportunity to ask questions - all questions answered to pt's and family satisfaction.    Patient tolerated chemotherapy with no complaints voiced.  Side effects with management reviewed with understanding verbalized.  Port site clean and dry with no bruising or swelling noted at site.  Good blood return noted before and after administration of chemotherapy.  Dressing intact.   Patient left in satisfactory condition with VSS and no s/s of distress noted.

## 2024-02-18 NOTE — Patient Instructions (Addendum)
 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  You will receive the chemotherapies Mitomycin  and Adrucil  as prescribed.  The intent of treatment is curative.   You will see the doctor regularly throughout treatment.  We will obtain blood work from you prior to every treatment and monitor your results to make sure it is safe to give your treatment. The doctor monitors your response to treatment by the way you are feeling, your blood work, and by obtaining scans periodically.  There will be wait times while you are here for treatment.  It will take about 30 minutes to 1 hour for your lab work to result.  Then there will be wait times while pharmacy mixes your medications.    Compazine : this will be given before your treatment to help with any nausea.   Mitomycin    About This Drug   Mitomycin  is used to treat cancer. It is given in the vein (IV).   Possible Side Effects   Bone marrow suppression. This is a decrease in the number of white blood cells, red blood cells, and platelets. This may raise your risk of infection, make you tired and weak, and raise your risk of bleeding.    Fever    Soreness of the mouth and throat. You may have red areas, white patches, or sores that hurt.    Nausea and vomiting (throwing up)    Decreased appetite (decreased hunger)    Skin and tissue irritation including redness, pain, warmth, or swelling at the IV site if the drug leaks out of the vein and into nearby tissue. Very rarely it may cause local tissue necrosis (death).    Hair loss. Hair loss is often temporary, although with certain medicine, hair loss can sometimes be permanent. Hair loss may happen suddenly or gradually. If you lose hair, you may lose it from your head, face, armpits, pubic area, chest, and/or legs. You may also notice your hair getting thin. Note: Not all possible side effects are included above.   Warnings and Precautions    Severe bone marrow suppression, which can be life-threatening.    Changes in your kidney function    A syndrome that affects your red blood cells, platelets and blood vessels in your kidneys, which can cause kidney failure and be life-threatening.    Inflammation (swelling) of the lungs. You may have a dry cough or trouble breathing.   Note: Some of the side effects above are very rare. If you have concerns and/or questions, please discuss them with your medical team.   Important Information    This drug may be present in the saliva, tears, sweat, urine, stool, vomit, semen, and vaginal secretions. Talk to your doctor and/or your nurse about the necessary precautions to take during this time.   Treating Side Effects   Manage tiredness by pacing your activities for the day.    Be sure to include periods of rest between energy-draining activities.    To decrease the risk of infection, wash your hands regularly.    Avoid close contact with people who have a cold, the flu, or other infections.    Take your temperature as your doctor or nurse tells you, and whenever you feel like you may have a fever.    To help decrease the risk of bleeding, use a soft toothbrush. Check with your nurse before using dental floss.    Be very careful when using knives or tools.  Use an electric shaver instead of a razor.    Mouth care is very important. Your mouth care should consist of routine, gentle cleaning of your teeth or dentures and rinsing your mouth with a mixture of 1/2 teaspoon of salt in 8 ounces of water or 1/2 teaspoon of baking soda in 8 ounces of water. This should be done at least after each meal and at bedtime.    If you have mouth sores, avoid mouthwash that has alcohol. Also avoid alcohol and smoking because they can bother your mouth and throat.  Drink plenty of fluids (a minimum of eight glasses per day is recommended). More may be recommended by your doctor.    If you throw up, you  should drink more fluids so that you do not become dehydrated (lack of water in the body from losing too much fluid).    To help with nausea and vomiting, eat small, frequent meals instead of three large meals a day. Choose foods and drinks that are at room temperature. Ask your nurse or doctor about other helpful tips and medicine that is available to help stop or lessen these symptoms.    To help with decreased appetite, eat small, frequent meals. Eat foods high in calories and protein, such as meat, poultry, fish, dry beans, tofu, eggs, nuts, milk, yogurt, cheese, ice cream, pudding, and nutritional supplements.    Consider using sauces and spices to increase taste. Daily exercise, with your doctors approval, may increase your appetite.    To help with hair loss, wash with a mild shampoo and avoid washing your hair every day. Avoid coloring your hair.    Avoid rubbing your scalp, pat your hair or scalp dry.    Limit your use of hair spray, electric curlers, blow dryers, and curling irons.    If you are interested in getting a wig, talk to your nurse and they can help you get in touch with programs in your local area.   Food and Drug Interactions   There are no known interactions of mitomycin  with food.     This drug may interact with other medicines. Tell your doctor and pharmacist about all the prescription and over-the-counter medicines and dietary supplements (vitamins, minerals, herbs, and others) that you are taking at this time. Also, check with your doctor or pharmacist before starting any new prescription or over-the-counter medicines, or dietary supplements to make sure that there are no interactions.   When to Call the Doctor  Call your doctor or nurse if you have any of these symptoms and/or any new or unusual symptoms:    Fever of 100.4 F (38 C) or higher    Chills    Tiredness that interferes with your daily activities    Feeling dizzy or lightheaded    Easy  bleeding or bruising    Wheezing and/or trouble breathing    Pain in your chest    Dry cough    Pain in your mouth or throat that makes it hard to eat or drink    Nausea that stops you from eating or drinking and/or is not relieved by prescribed medicines    Throwing up more than 3 times a day    Lasting loss of appetite or rapid weight loss of five pounds in a week    Swelling of the face, hands, feet, or any other part of the body    Decreased or very dark urine    While you are getting this  drug, please tell your nurse right away if you have any pain, redness, or swelling at the site of the IV infusion.    If you think you may be pregnant   Reproduction Warnings    Pregnancy warning: It is not known if this drug may harm an unborn child. For this reason, be sure to talk with your doctor if you are pregnant or planning to become pregnant while receiving this drug. Let your doctor know right away if you think you may be pregnant.    Breastfeeding warning: It is not known if this drug passes into breast milk. For this reason, women should talk to their doctor about the risks and benefits of breastfeeding during treatment with this drug because this drug may enter the breast milk and cause harm to a breastfeeding baby.    Fertility warning: Fertility studies have not been done with this drug. Talk with your doctor or nurse if you plan to have children. Ask for information on sperm or egg banking.    5-Fluorouracil  (Adrucil ; 5FU)  About This Drug  Fluorouracil  is used to treat cancer. It is given in the vein (IV). It is given as an IV push from a syringe and also as a continuous infusion given via an ambulatory pump (a pump you take home and wear for a specified amount of time).  Possible Side Effects   Bone marrow suppression. This is a decrease in the number of white blood cells, red blood cells, and platelets. This may raise your risk of infection, make you tired and weak  (fatigue), and raise your risk of bleeding   Changes in the tissue of the heart and/or heart attack. Some changes may happen that can cause your heart to have less ability to pump blood.   Blurred vision or other changes in eyesight   Nausea and throwing up (vomiting)   Diarrhea (loose bowel movements)   Ulcers - sores that may cause pain or bleeding in your digestive tract, which includes your mouth, esophagus, stomach, small/large intestines and rectum   Soreness of the mouth and throat. You may have red areas, white patches, or sores that hurt.   Allergic reactions, including anaphylaxis are rare but may happen in some patients. Signs of allergic reaction to this drug may be swelling of the face, feeling like your tongue or throat are swelling, trouble breathing, rash, itching, fever, chills, feeling dizzy, and/or feeling that your heart is beating in a fast or not normal way. If this happens, do not take another dose of this drug. You should get urgent medical treatment.   Sensitivity to light (photosensitivity). Photosensitivity means that you may become more sensitive to the sun and/or light. You may get a skin rash/reaction if you are in the sun or are exposed to sun lamps and tanning beds. Your eyes may water more, mostly in bright light.   Changes in your nail color, nail loss and/or brittle nail   Darkening of the skin, or changes to the color of your skin and/or veins used for infusion   Rash, dry skin, or itching  Note: Not all possible side effects are included above.  Warnings and Precautions   Hand-and-foot syndrome. The palms of your hands or soles of your feet may tingle, become numb, painful, swollen, or red.   Changes in your central nervous system can happen. The central nervous system is made up of your brain and spinal cord. You could feel extreme tiredness, agitation, confusion, hallucinations (  see or hear things that are not there), trouble understanding or  speaking, loss of control of your bowels or bladder, eyesight changes, numbness or lack of strength to your arms, legs, face, or body, or coma. If you start to have any of these symptoms let your doctor know right away.   Side effects of this drug may be unexpectedly severe in some patients  Note: Some of the side effects above are very rare. If you have concerns and/or questions, please discuss them with your medical team.   Important Information   This drug may be present in the saliva, tears, sweat, urine, stool, vomit, semen, and vaginal secretions. Talk to your doctor and/or your nurse about the necessary precautions to take during this time.   Treating Side Effects   Manage tiredness by pacing your activities for the day.   Be sure to include periods of rest between energy-draining activities.   To help decrease the risk of infections, wash your hands regularly.   Avoid close contact with people who have a cold, the flu, or other infections.   Take your temperature as your doctor or nurse tells you, and whenever you feel like you may have a fever.   Use a soft toothbrush. Check with your nurse before using dental floss.   Be very careful when using knives or tools.   Use an electric shaver instead of a razor.   If you have a nose bleed, sit with your head tipped slightly forward. Apply pressure by lightly pinching the bridge of your nose between your thumb and forefinger. Call your doctor if you feel dizzy or faint or if the bleeding doesnt stop after 10 to 15 minutes.   Drink plenty of fluids (a minimum of eight glasses per day is recommended).   If you throw up or have loose bowel movements, you should drink more fluids so that you do not  become dehydrated (lack of water in the body from losing too much fluid).   To help with nausea and vomiting, eat small, frequent meals instead of three large meals a day. Choose foods and drinks that are at room temperature. Ask your  nurse or doctor about other helpful tips and medicine that is available to help, stop, or lessen these symptoms.   If you have diarrhea, eat low-fiber foods that are high in protein and calories and avoid foods that can irritate your digestive tracts or lead to cramping.   Ask your nurse or doctor about medicine that can lessen or stop your diarrhea.   Mouth care is very important. Your mouth care should consist of routine, gentle cleaning of your teeth or dentures and rinsing your mouth with a mixture of 1/2 teaspoon of salt in 8 ounces of water or 1/2 teaspoon of baking soda in 8 ounces of water. This should be done at least after each meal and at bedtime.   If you have mouth sores, avoid mouthwash that has alcohol. Also avoid alcohol and smoking because they can bother your mouth and throat.   Keeping your nails moisturized may help with brittleness.   To help with itching, moisturize your skin several times day.   Use sunscreen with SPF 30 or higher when you are outdoors even for a short time. Cover up when you are out in the sun. Wear wide-brimmed hats, long-sleeved shirts, and pants. Keep your neck, chest, and back covered. Wear dark sun glasses when in the sun or bright lights.   If  you get a rash do not put anything on it unless your doctor or nurse says you may. Keep the area around the rash clean and dry. Ask your doctor for medicine if your rash bothers you.   Keeping your pain under control is important to your well-being. Please tell your doctor or nurse if you are experiencing pain.   Food and Drug Interactions   There are no known interactions of fluorouracil  with food.   Check with your doctor or pharmacist about all other prescription medicines and over-the-counter medicines and dietary supplements (vitamins, minerals, herbs and others) you are taking before starting this medicine as there are known drug interactions with 5-fluoroucacil. Also, check with your doctor or  pharmacist before starting any new prescription or over-the-counter medicines, or dietary supplements to make sure that there are no interactions.  When to Call the Doctor  Call your doctor or nurse if you have any of these symptoms and/or any new or unusual symptoms:   Fever of 100.4 F (38 C) or higher   Chills   Easy bleeding or bruising   Nose bleed that doesnt stop bleeding after 10-15 minutes   Trouble breathing   Feeling dizzy or lightheaded   Feeling that your heart is beating in a fast or not normal way (palpitations)   Chest pain or symptoms of a heart attack. Most heart attacks involve pain in the center of the chest that lasts more than a few minutes. The pain may go away and come back or it can be constant. It can feel like pressure, squeezing, fullness, or pain. Sometimes pain is felt in one or both arms, the back, neck, jaw, or stomach. If any of these symptoms last 2 minutes, call 911.   Confusion and/or agitation   Hallucinations   Trouble understanding or speaking   Loss of control of bowels or bladder   Blurry vision or changes in your eyesight   Headache that does not go away   Numbness or lack of strength to your arms, legs, face, or body   Nausea that stops you from eating or drinking and/or is not relieved by prescribed medicines   Throwing up more than 3 times a day   Diarrhea, 4 times in one day or diarrhea with lack of strength or a feeling of being dizzy   Pain in your mouth or throat that makes it hard to eat or drink   Pain along the digestive tract - especially if worse after eating   Blood in your vomit (bright red or coffee-ground) and/or stools (bright red, or black/tarry)   Coughing up blood   Tiredness that interferes with your daily activities   Painful, red, or swollen areas on your hands or feet or around your nails   A new rash or a rash that is not relieved by prescribed medicines   Develop sensitivity to  sunlight/light   Numbness and/or tingling of your hands and/or feet   Signs of allergic reaction: swelling of the face, feeling like your tongue or throat are swelling, trouble breathing, rash, itching, fever, chills, feeling dizzy, and/or feeling that your heart is beating in a fast or not normal way. If this happens, call 911 for emergency care.   If you think you are pregnant or may have impregnated your partner  Reproduction Warnings   Pregnancy warning: This drug may have harmful effects on the unborn baby. Women of child bearing potential should use effective methods of birth control during your  cancer treatment and 3 months after treatment. Men with female partners of childbearing potential should use effective methods of birth control during your cancer treatment and for 3 months after your cancer treatment. Let your doctor know right away if you think you may be pregnant or may have impregnated your partner.   Breastfeeding warning: It is not known if this drug passes into breast milk. For this reason, Women should not breastfeed during treatment because this drug could enter the breast milk and cause harm to a breastfeeding baby.   Fertility warning: In men and women both, this drug may affect your ability to have children in the future. Talk with your doctor or nurse if you plan to have children. Ask for information on sperm or egg banking.   SELF CARE ACTIVITIES WHILE ON CHEMOTHERAPY/IMMUNOTHERAPY:  Hydration Increase your fluid intake 48 hours prior to treatment and drink at least 8 to 12 cups (64 ounces) of water/decaffeinated beverages per day after treatment. You can still have your cup of coffee or soda but these beverages do not count as part of your 8 to 12 cups that you need to drink daily. No alcohol intake.  Medications Continue taking your normal prescription medication as prescribed.  If you start any new herbal or new supplements please let us  know first to make sure  it is safe.  Mouth Care Have teeth cleaned professionally before starting treatment. Keep dentures and partial plates clean. Use soft toothbrush and do not use mouthwashes that contain alcohol. Biotene is a good mouthwash that is available at most pharmacies or may be ordered by calling [phone]. Use warm salt water gargles (1 teaspoon salt per 1 quart warm water) before and after meals and at bedtime. Or you may rinse with 2 tablespoons of three-percent hydrogen peroxide mixed in eight ounces of water. If you are still having problems with your mouth or sores in your mouth please call the clinic. If you need dental work, please let the doctor know before you go for your appointment so that we can coordinate the best possible time for you in regards to your chemo regimen. You need to also let your dentist know that you are actively taking chemo. We may need to do labs prior to your dental appointment.  Skin Care Always use sunscreen that has not expired and with SPF (Sun Protection Factor) of 50 or higher. Wear hats to protect your head from the sun. Remember to use sunscreen on your hands, ears, face, & feet.  Use good moisturizing lotions such as udder cream, eucerin, or even Vaseline. Some chemotherapies can cause dry skin, color changes in your skin and nails.    Avoid long, hot showers or baths. Use gentle, fragrance-free soaps and laundry detergent. Use moisturizers, preferably creams or ointments rather than lotions because the thicker consistency is better at preventing skin dehydration. Apply the cream or ointment within 15 minutes of showering. Reapply moisturizer at night, and moisturize your hands every time after you wash them.   Infection Prevention Please wash your hands for at least 30 seconds using warm soapy water. Handwashing is the #1 way to prevent the spread of germs. Stay away from sick people or people who are getting over a cold. If you develop respiratory systems such  as green/yellow mucus production or productive cough or persistent cough let us  know and we will see if you need an antibiotic. It is a good idea to keep a pair of gloves on when  going into grocery stores/[employer] to decrease your risk of coming into contact with germs on the carts, etc. Carry alcohol hand gel with you at all times and use it frequently if out in public. If your temperature reaches 100.5 or higher please call the clinic and let us  know.  If it is after hours or on the weekend please go to the ER if your temperature is over 100.4.  Please have your own personal thermometer at home to use.    Sex and bodily fluids If you are going to have sex, a condom must be used to protect the person that isn't taking immunotherapy. For a few days after treatment, immunotherapy can be excreted through your bodily fluids.  When using the toilet please close the lid and flush the toilet twice.  Do this for a few day after you have had immunotherapy.   Contraception It is not known for sure whether or not immunotherapy drugs can be passed on through semen or secretions from the vagina. Because of this some doctors advise people to use a barrier method if you have sex during treatment. This applies to vaginal, anal or oral sex.  Generally, doctors advise a barrier method only for the time you are actually having the treatment and for about a week after your treatment.  Advice like this can be worrying, but this does not mean that you have to avoid being intimate with your partner. You can still have close contact with your partner and continue to enjoy sex.  Animals If you have cats or birds we just ask that you not change the litter or change the cage.  Please have someone else do this for you while you are on immunotherapy.   Food Safety During and After Cancer Treatment Food safety is important for people both during and after cancer treatment. Cancer and cancer treatments, such as chemotherapy,  radiation therapy, and stem cell/bone marrow transplantation, often weaken the immune system. This makes it harder for your body to protect itself from foodborne illness, also called food poisoning. Foodborne illness is caused by eating food that contains harmful bacteria, parasites, or viruses.  Foods to avoid Some foods have a higher risk of becoming tainted with bacteria. These include: Unwashed fresh fruit and vegetables, especially leafy vegetables that can hide dirt and other contaminants Raw sprouts, such as alfalfa sprouts Raw or undercooked beef, especially ground beef, or other raw or undercooked meat and poultry Fatty, fried, or spicy foods immediately before or after treatment.  These can sit heavy on your stomach and make you feel nauseous. Raw or undercooked shellfish, such as oysters. Sushi and sashimi, which often contain raw fish.  Unpasteurized beverages, such as unpasteurized fruit juices, raw milk, raw yogurt, or cider Undercooked eggs, such as soft boiled, over easy, and poached; raw, unpasteurized eggs; or foods made with raw egg, such as homemade raw cookie dough and homemade mayonnaise  Simple steps for food safety  Shop smart. Do not buy food stored or displayed in an unclean area. Do not buy bruised or damaged fruits or vegetables. Do not buy cans that have cracks, dents, or bulges. Pick up foods that can spoil at the end of your shopping trip and store them in a cooler on the way home.  Prepare and clean up foods carefully. Rinse all fresh fruits and vegetables under running water, and dry them with a clean towel or paper towel. Clean the top of cans before opening them. After preparing food,  wash your hands for 20 seconds with hot water and soap. Pay special attention to areas between fingers and under nails. Clean your utensils and dishes with hot water and soap. Disinfect your kitchen and cutting boards using 1 teaspoon of liquid, unscented bleach mixed into 1  quart of water.    Dispose of old food. Eat canned and packaged food before its expiration date (the use by or best before date). Consume refrigerated leftovers within 3 to 4 days. After that time, throw out the food. Even if the food does not smell or look spoiled, it still may be unsafe. Some bacteria, such as Listeria, can grow even on foods stored in the refrigerator if they are kept for too long.  Take precautions when eating out. At restaurants, avoid buffets and salad bars where food sits out for a long time and comes in contact with many people. Food can become contaminated when someone with a virus, often a norovirus, or another bug handles it. Put any leftover food in a to-go container yourself, rather than having the server do it. And, refrigerate leftovers as soon as you get home. Choose restaurants that are clean and that are willing to prepare your food as you order it cooked.    SYMPTOMS TO REPORT AS SOON AS POSSIBLE AFTER TREATMENT:  FEVER GREATER THAN 100.4 F CHILLS WITH OR WITHOUT FEVER NAUSEA AND VOMITING THAT IS NOT CONTROLLED WITH YOUR NAUSEA MEDICATION UNUSUAL SHORTNESS OF BREATH UNUSUAL BRUISING OR BLEEDING TENDERNESS IN MOUTH AND THROAT WITH OR WITHOUT PRESENCE OF ULCERS URINARY PROBLEMS BOWEL PROBLEMS UNUSUAL RASH     Wear comfortable clothing and clothing appropriate for easy access to any Portacath or PICC line. Let us  know if there is anything that we can do to make your therapy better!   What to do if you need assistance after hours or on the weekends: CALL [phone].  HOLD on the line, do not hang up.  You will hear multiple messages but at the end you will be connected with a nurse triage line.  They will contact the doctor if necessary.  Most of the time they will be able to assist you.  Do not call the hospital operator.    I have been informed and understand all of the instructions given to me and have received a copy. I have been  instructed to call the clinic [phone] or my family physician as soon as possible for continued medical care, if indicated. I do not have any more questions at this time but understand that I may call the Cancer Center or the Patient Navigator at [phone] during office hours should I have questions or need assistance in obtaining follow-up care.

## 2024-02-19 ENCOUNTER — Ambulatory Visit
Admission: RE | Admit: 2024-02-19 | Discharge: 2024-02-19 | Disposition: A | Discharge status: Home / Self Care | Source: Ambulatory Visit | Attending: Radiation Oncology | Admitting: Radiation Oncology

## 2024-02-19 ENCOUNTER — Other Ambulatory Visit: Payer: Self-pay

## 2024-02-19 DIAGNOSIS — Z510 Encounter for antineoplastic radiation therapy: Secondary | ICD-10-CM | POA: Diagnosis not present

## 2024-02-19 LAB — RAD ONC ARIA SESSION SUMMARY
Course Elapsed Days: 1
Plan Fractions Treated to Date: 2
Plan Prescribed Dose Per Fraction: 1.8 Gy
Plan Total Fractions Prescribed: 30
Plan Total Prescribed Dose: 54 Gy
Reference Point Dosage Given to Date: 3.6 Gy
Reference Point Session Dosage Given: 1.8 Gy
Session Number: 2

## 2024-02-20 ENCOUNTER — Ambulatory Visit
Admission: RE | Admit: 2024-02-20 | Discharge: 2024-02-20 | Disposition: A | Discharge status: Home / Self Care | Source: Ambulatory Visit | Attending: Radiation Oncology | Admitting: Radiation Oncology

## 2024-02-20 ENCOUNTER — Encounter: Payer: Self-pay | Admitting: Oncology

## 2024-02-20 ENCOUNTER — Other Ambulatory Visit: Payer: Self-pay

## 2024-02-20 ENCOUNTER — Inpatient Hospital Stay

## 2024-02-20 DIAGNOSIS — Z510 Encounter for antineoplastic radiation therapy: Secondary | ICD-10-CM | POA: Diagnosis not present

## 2024-02-20 LAB — RAD ONC ARIA SESSION SUMMARY
Course Elapsed Days: 2
Plan Fractions Treated to Date: 3
Plan Prescribed Dose Per Fraction: 1.8 Gy
Plan Total Fractions Prescribed: 30
Plan Total Prescribed Dose: 54 Gy
Reference Point Dosage Given to Date: 5.4 Gy
Reference Point Session Dosage Given: 1.8 Gy
Session Number: 3

## 2024-02-22 ENCOUNTER — Ambulatory Visit
Admission: RE | Admit: 2024-02-22 | Discharge: 2024-02-22 | Disposition: A | Discharge status: Home / Self Care | Payer: Self-pay | Source: Ambulatory Visit | Attending: Radiation Oncology | Admitting: Radiation Oncology

## 2024-02-22 ENCOUNTER — Other Ambulatory Visit: Payer: Self-pay

## 2024-02-22 ENCOUNTER — Inpatient Hospital Stay

## 2024-02-22 ENCOUNTER — Ambulatory Visit
Admission: RE | Admit: 2024-02-22 | Discharge: 2024-02-22 | Disposition: A | Source: Ambulatory Visit | Attending: Radiation Oncology

## 2024-02-22 VITALS — BP 149/71 | HR 66

## 2024-02-22 DIAGNOSIS — C774 Secondary and unspecified malignant neoplasm of inguinal and lower limb lymph nodes: Secondary | ICD-10-CM | POA: Insufficient documentation

## 2024-02-22 DIAGNOSIS — Z510 Encounter for antineoplastic radiation therapy: Secondary | ICD-10-CM | POA: Insufficient documentation

## 2024-02-22 DIAGNOSIS — Z5111 Encounter for antineoplastic chemotherapy: Secondary | ICD-10-CM | POA: Insufficient documentation

## 2024-02-22 DIAGNOSIS — D6481 Anemia due to antineoplastic chemotherapy: Secondary | ICD-10-CM | POA: Insufficient documentation

## 2024-02-22 DIAGNOSIS — Z452 Encounter for adjustment and management of vascular access device: Secondary | ICD-10-CM | POA: Insufficient documentation

## 2024-02-22 DIAGNOSIS — K521 Toxic gastroenteritis and colitis: Secondary | ICD-10-CM | POA: Insufficient documentation

## 2024-02-22 DIAGNOSIS — C210 Malignant neoplasm of anus, unspecified: Secondary | ICD-10-CM | POA: Insufficient documentation

## 2024-02-22 LAB — RAD ONC ARIA SESSION SUMMARY
Course Elapsed Days: 4
Plan Fractions Treated to Date: 4
Plan Prescribed Dose Per Fraction: 1.8 Gy
Plan Total Fractions Prescribed: 30
Plan Total Prescribed Dose: 54 Gy
Reference Point Dosage Given to Date: 7.2 Gy
Reference Point Session Dosage Given: 1.8 Gy
Session Number: 4

## 2024-02-22 NOTE — Patient Instructions (Signed)
 [facility] - A DEPT OF [facility]  Discharge Instructions: Thank you for choosing [facility] to provide your oncology and hematology care.  If you have a lab appointment with the Cancer Center - please note that after April 8th, 2024, all labs will be drawn in the cancer center.  You do not have to check in or register with the main entrance as you have in the past but will complete your check-in in the cancer center.  Wear comfortable clothing and clothing appropriate for easy access to any Portacath or PICC line.   We strive to give you quality time with your provider. You may need to reschedule your appointment if you arrive late (15 or more minutes).  Arriving late affects you and other patients whose appointments are after yours.  Also, if you miss three or more appointments without notifying the office, you may be dismissed from the clinic at the providers discretion.      For prescription refill requests, have your pharmacy contact our office and allow 72 hours for refills to be completed.    Today you had your continuous ambulatory pump disconnected.    To help prevent nausea and vomiting after your treatment, we encourage you to take your nausea medication as directed.  BELOW ARE SYMPTOMS THAT SHOULD BE REPORTED IMMEDIATELY: *FEVER GREATER THAN 100.4 F (38 C) OR HIGHER *CHILLS OR SWEATING *NAUSEA AND VOMITING THAT IS NOT CONTROLLED WITH YOUR NAUSEA MEDICATION *UNUSUAL SHORTNESS OF BREATH *UNUSUAL BRUISING OR BLEEDING *URINARY PROBLEMS (pain or burning when urinating, or frequent urination) *BOWEL PROBLEMS (unusual diarrhea, constipation, pain near the anus) TENDERNESS IN MOUTH AND THROAT WITH OR WITHOUT PRESENCE OF ULCERS (sore throat, sores in mouth, or a toothache) UNUSUAL RASH, SWELLING OR PAIN  UNUSUAL VAGINAL DISCHARGE OR ITCHING   Items with * indicate a potential emergency and should be followed up as soon as possible or go  to the Emergency Department if any problems should occur.  Please show the CHEMOTHERAPY ALERT CARD or IMMUNOTHERAPY ALERT CARD at check-in to the Emergency Department and triage nurse.  Should you have questions after your visit or need to cancel or reschedule your appointment, please contact [facility] [place] - A DEPT OF [facility] [phone]  and follow the prompts.  Office hours are 8:00 a.m. to 4:30 p.m. Monday - Friday. Please note that voicemails left after 4:00 p.m. may not be returned until the following business day.  We are closed weekends and major holidays. You have access to a nurse at all times for urgent questions. Please call the main number to the clinic [phone] and follow the prompts.  For any non-urgent questions, you may also contact your provider using MyChart. We now offer e-Visits for anyone 35 and older to request care online for non-urgent symptoms. For details visit [email or web address].   Also download the MyChart app! Go to the app store, search MyChart, open the app, select [place], and log in with your MyChart username and password.

## 2024-02-22 NOTE — Progress Notes (Signed)
 Brandi Mitchell presents to have home infusion pump d/c'd and for port-a-cath deaccess with flush.  Portacath located right chest wall accessed with  H 20 needle.  Good blood return present. Portacath flushed with NS 500 ml , needle removed intact.  Procedure tolerated well and without incident.

## 2024-02-25 ENCOUNTER — Telehealth: Payer: Self-pay

## 2024-02-25 ENCOUNTER — Other Ambulatory Visit: Payer: Self-pay

## 2024-02-25 ENCOUNTER — Ambulatory Visit
Admission: RE | Admit: 2024-02-25 | Discharge: 2024-02-25 | Disposition: A | Discharge status: Home / Self Care | Payer: Self-pay | Source: Ambulatory Visit | Attending: Radiation Oncology | Admitting: Radiation Oncology

## 2024-02-25 DIAGNOSIS — K1379 Other lesions of oral mucosa: Secondary | ICD-10-CM

## 2024-02-25 LAB — RAD ONC ARIA SESSION SUMMARY
Course Elapsed Days: 7
Plan Fractions Treated to Date: 5
Plan Prescribed Dose Per Fraction: 1.8 Gy
Plan Total Fractions Prescribed: 30
Plan Total Prescribed Dose: 54 Gy
Reference Point Dosage Given to Date: 9 Gy
Reference Point Session Dosage Given: 1.8 Gy
Session Number: 5

## 2024-02-25 MED ORDER — MAGIC MOUTHWASH W/LIDOCAINE: 15.0000 mL | Freq: Four times a day (QID) | ORAL | 1 refills | Status: DC | PRN

## 2024-02-25 NOTE — Telephone Encounter (Signed)
 Chemotherapy 24 hour call.  No complaints voiced only the start of mouth sores.  Prescription sent to her pharmacy.  Reviewed numbers and when to call with understanding verbalized.

## 2024-02-26 ENCOUNTER — Encounter: Payer: Self-pay | Admitting: Physical Therapy

## 2024-02-26 ENCOUNTER — Other Ambulatory Visit: Payer: Self-pay

## 2024-02-26 ENCOUNTER — Encounter: Attending: Radiation Oncology | Admitting: Physical Therapy

## 2024-02-26 ENCOUNTER — Other Ambulatory Visit: Payer: Self-pay | Admitting: *Deleted

## 2024-02-26 ENCOUNTER — Ambulatory Visit: Payer: Self-pay

## 2024-02-26 DIAGNOSIS — C210 Malignant neoplasm of anus, unspecified: Secondary | ICD-10-CM | POA: Insufficient documentation

## 2024-02-26 DIAGNOSIS — M6281 Muscle weakness (generalized): Secondary | ICD-10-CM | POA: Insufficient documentation

## 2024-02-26 DIAGNOSIS — K1379 Other lesions of oral mucosa: Secondary | ICD-10-CM

## 2024-02-26 LAB — RAD ONC ARIA SESSION SUMMARY
Course Elapsed Days: 8
Plan Fractions Treated to Date: 6
Plan Prescribed Dose Per Fraction: 1.8 Gy
Plan Total Fractions Prescribed: 30
Plan Total Prescribed Dose: 54 Gy
Reference Point Dosage Given to Date: 10.8 Gy
Reference Point Session Dosage Given: 1.8 Gy
Session Number: 6

## 2024-02-26 MED ORDER — MAGIC MOUTHWASH W/LIDOCAINE: 15.0000 mL | Freq: Four times a day (QID) | ORAL | 1 refills | Status: AC | PRN

## 2024-02-26 NOTE — Therapy (Signed)
 " OUTPATIENT PHYSICAL THERAPY FEMALE PELVIC TREATMENT   Patient Name: Brandi Mitchell MRN: [id number] DOB:1960/12/20, 64 y.o., female Today's Date: 02/26/2024  END OF SESSION:  PT End of Session - 02/26/24 0840     Visit Number 3    Date for Recertification  07/31/24    Authorization Type [employer]    Authorization - Visit Number 1    Authorization - Number of Visits 20    PT Start Time [phone]    PT Stop Time 0920    PT Time Calculation (min) 45 min    Activity Tolerance Patient tolerated treatment well    Behavior During Therapy [facility] for tasks assessed/performed          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   Heart murmur    I have had it all my life but no issues with it   Hypothyroidism    Migraine headache    Thyroid  disease    Past Surgical History:  Procedure Laterality Date   ABDOMINAL HYSTERECTOMY     BREAST EXCISIONAL BIOPSY Left    CESAREAN SECTION     FOOT SURGERY     HEMORRHOID SURGERY N/A 01/02/2024   Procedure: HEMORRHOIDECTOMY;  Surgeon: Mavis Anes, MD;  Location: [facility];  Service: General;  Laterality: N/A;   INGUINAL LYMPH NODE BIOPSY Left 01/02/2024   Procedure: BIOPSY, LYMPH NODE, INGUINAL, OPEN;  Surgeon: Mavis Anes, MD;  Location: [facility];  Service: General;  Laterality: Left;   IR IMAGING GUIDED PORT INSERTION  02/15/2024   TMJ ARTHROPLASTY     TONSILLECTOMY     TOTAL HIP ARTHROPLASTY Left 03/13/2017   Procedure: TOTAL HIP ARTHROPLASTY ANTERIOR APPROACH;  Surgeon: Sheril Coy, MD;  Location: [facility] OR;  Service: Orthopedics;  Laterality: Left;   TOTAL HIP ARTHROPLASTY Right 10/30/2017   Procedure: TOTAL HIP ARTHROPLASTY ANTERIOR APPROACH;  Surgeon: Sheril Coy, MD;  Location: [facility] OR;  Service: Orthopedics;  Laterality: Right;   TRIGGER FINGER RELEASE Left    Patient Active Problem List   Diagnosis Date Noted   Anal cancer (HCC) 01/28/2024   Lymph node  enlargement 01/02/2024   Bleeding hemorrhoid 01/02/2024   Age-related osteoporosis without current pathological fracture 08/25/2021   Amnesia 08/25/2021   Arm paresthesia, left 08/25/2021   Carpal tunnel syndrome 08/25/2021   Chronic pain 08/25/2021   Disorder of female genital organs 08/25/2021   Essential hypertension 08/25/2021   History of adenomatous polyp of colon 08/25/2021   Hypothyroidism 08/25/2021   Migraine, unspecified, not intractable, without status migrainosus 08/25/2021   Osteopenia 08/25/2021   Osteoporosis 08/25/2021   Other specified nonpsychotic mental disorders 08/25/2021   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 I am still doing the chemotherapy. I have not had burning of the perineal area at this. I started last week with radiation. I have the spray bottle to clean the area. Patient uses the handheld shower to massage the rectal area. The stools have been fine. Patient having solid bowel movements daily. No urinary leakage noted. I am contracting the pelvic floor during the day.   Eval: Anal cancer diagnosed on 01/08/24 Pre radiation evaluation Pt to start radiation on 12/29 Has dilators already too! 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 Yes NPRS scale: 1/10 02/26/24: no pain Pain location: Anal  Pain type: discomfort Pain description: intermittent   Aggravating factors: bowel movement Relieving factors: no bowel movement  PRECAUTIONS: Other: anal cancer  RED FLAGS: None   WEIGHT BEARING RESTRICTIONS: No  FALLS:  Has patient fallen in last 6 months? 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    BOWEL MOVEMENT: Pain with bowel movement: Yes, discomfort Type of bowel movement:Type (Bristol Stool Scale) Type 4, Frequency daily, Strain no, and Splinting no Fully empty rectum: No Leakage: No                                                 Bowel urgency: no Pads: No Fiber supplement/laxative No  URINATION: Pain with urination: No Fully empty bladder: Yes:                                           Post-void dribble: No Stream: Strong Urgency: No Frequency:during the day every 2-3 hours                                                        Nocturia: No   Leakage: none Pads/briefs: No  INTERCOURSE:  Ability to have vaginal penetration Yes  Pain with intercourse: none Dryness: Yes  Climax: yes Marinoff Scale: 0/3  PREGNANCY: Vaginal deliveries 1 Tearing No Episiotomy No C-section deliveries 1 Currently pregnant No  PROLAPSE: None   OBJECTIVE:  Note: Objective measures were completed at Evaluation unless otherwise noted.  DIAGNOSTIC FINDINGS:  FINAL  MICROSCOPIC DIAGNOSIS:  A. HEMORRHOID, HEMORRHOIDECTOMY: -  Invasive moderately differentiated squamous cell carcinoma (depth of invasion 5 mm -  An unoriented lateral margin is positive.   The deep margin negative but is less than 1 mm.  Note: Dr. Rebbecca has peer reviewed the case and agrees with the interpretation.  Initial results were discussed with Dr. Mavis on 01/03/2024  B. LYMPH NODE, LEFT INGUINAL, BIOPSY: -  Metastatic moderately differentiated squamous cell carcinoma.   PATIENT SURVEYS:  PFIQ-7:  0 UIQ-7 0 CRAIG -7 0 Female Sexual Function Index (FSFI) Questionnaire 0  COGNITION: Overall cognitive status: Within functional limits for tasks assessed     SENSATION: Light touch: Appears intact   FUNCTIONAL TESTS:  5 times sit to stand: 11 sec 6 minute walk test: 1745 feet Single leg stance:  Rt:no pelvic drop  Lt:no pelvic drop Sit-up test:able to go into sitting without arm support Squat:able to go fully down Bed mobility:  GAIT: Assistive device utilized: None  POSTURE: No Significant postural limitations   LUMBARAROM/PROM: Lumbar ROIM is full   LOWER EXTREMITY MNF:qloo bilateral hip ROM   LOWER EXTREMITY MMT:  MMT Right eval Left eval  Hip abduction 4/5 5/5   (Blank rows = not tested) PALPATION: Pelvic Alignment: ASIS  Abdominal: at first contract the abdominals  with hinging at the thoracic lumbar area but after she was taught to contract the abdominals with lower abdominals was able to sit up without difficulty  Diastasis: No Distortion: No                  External Perineal Exam: not assessed at this time                             Internal Pelvic Floor: not assessed at this time  Patient confirms identification and approves PT to assess internal pelvic floor and treatment No; Active cancer and recovering from Hemorrhoidectomy.  All internal or external pelvic floor assessments and/or treatments are completed with proper hand  hygiene and gloves hands. If needed gloves are changed with hand hygiene during patient care time.  PELVIC MMT:   MMT eval  Vaginal   Internal Anal Sphincter   External Anal Sphincter   Puborectalis   (Blank rows = not tested)        TODAY'S TREATMENT:   02/26/24 Manual: Soft tissue mobilization: Abdominal massage in a circular pattern to promote peristalic motion of the intestines to keep the stool moving through the intestines.  Exercises: Stretches/mobility: Hamstring stretch with strap holding 30 sec.  Supine lateral hip stretch with strap holding 30 sec each Supine hip adductor stretch with strap holding 30 sec each Laying on side hip flexor stretch holding 30 sec each 1/2 kneel trunk rotation 5 times each way then switch legs and repeat Therapeutic activities: Functional strengthening activities: Educated patient on how to brace her abdomen and not bulge her lower abdomen with coughing to reduce strain on the pelvic floor and future urinary leakage.  Self-care: Discussed with patient on her skin reactions to the radiation and what could come about and how to handle it.  Discussed with patient on using her hand held shower to massage and clean the rectal and perineal area.  Educated patient on abdominal massage incase she has difficulty with having a bowel movement    02/06/24 Neuromuscular re-education: Down training: Diaphragmatic breathing in supine to relax the pelvic floor Self-care: Educated patient on the dilator and how to use it but will not use until 2 weeks after radiation.  Educated patient on what to expect with radiation and changes she will see Educated patient on increased in loose stools, using depends, being near a bathroom, diet can change the stools Educated patient on how to do her yoga, light weight lifting, continuing with her regular exercise program  DATE: 01/31/24  EVAL Examination completed, findings reviewed, pt educated on POC, HEP, and female pelvic floor anatomy, reasoning with pelvic floor assessment internally with pt consent, and abdominal massage. Pt motivated to participate in PT and agreeable to attempt recommendations.     PATIENT EDUCATION:  02/06/24 Education details: Access Code: [email or web address], rectal care, pelvic floor contraction, energy conservation, continues with her yoga and gym program Person educated: Patient Education method: Explanation, Demonstration, Tactile cues, Verbal cues, and Handouts Education comprehension: verbalized understanding, returned demonstration, verbal cues required, tactile cues required, and needs further education  HOME EXERCISE PROGRAM: 02/06/24 Access Code: [email or web address] URL: [email or web address] Date: 02/06/2024 Prepared by: Channing Pereyra  Program Notes use the dilators 2 weeks after radiation. If you have blisters and discomfort then ask for cream. You may have multiple bowel movements  so have depends . drink plenty of fluids including electrolyte drinks. eat small meals to see how your respondssome people with use immodium for the loose stoolsyoga at home: yoga by Shelba on you tubeweight lifting doing the light weights   Exercises - Seated Pelvic Floor Contraction  - 3 x daily - 7 x weekly - 1 sets - 10 reps - Seated Quick Flick Pelvic Floor Contractions  - 3 x daily - 7 x weekly - 1 sets - 5 reps - Supine Diaphragmatic Breathing  - 1 x daily - 7 x weekly - 3 sets - 10 reps - Seated Diaphragmatic Breathing  - 1 x daily - 7 x weekly - 3 sets - 10 reps  ASSESSMENT:  CLINICAL IMPRESSION: Patient is a 64 y.o. female who was seen today for physical therapy  treatment for anal cancer. Patient was diagnosis  on 01/08/24. She will have 5 weeks fo radiation and chemothrapy starting on 02/18/24 and ending on 22/9/26. Patient is not having skin changes  yet from radiation. She is having bowel movements daily and are firm. She is  not leaking urine. Patietn has learned abdominal massage in case she has difficulty with bowel movements. She will bulge her lower abdomen with coughing so she was educated on how to cough with bracing the abdomen and contract the pelvic floor. Patient will return to be reassessed after her treatments to see what deficits have begun and to work on them.   OBJECTIVE IMPAIRMENTS: decreased activity tolerance and decreased education for care of perineal area and overall health during her chemotherapy radiation treatments.   ACTIVITY LIMITATIONS: not at this time but can change after treatment.   PARTICIPATION LIMITATIONS: community activity  PERSONAL FACTORS: 1 comorbidity: anal cancer are also affecting patient's functional outcome.   REHAB POTENTIAL: Excellent  CLINICAL DECISION MAKING: Evolving/moderate complexity  EVALUATION COMPLEXITY: Moderate   GOALS: Goals reviewed with patient? Yes  SHORT TERM GOALS: Target date: 02/28/24  Patient educated on bowel health.  Baseline: Goal status: Met 02/01/24  2.  Patient educated on energy conservation for during her cancer treatments Baseline:  Goal status: Met 02/01/24  3.  Patient educated on continuing her exercise program to keep her energy and assist with recovery from the cancer treatments.  Baseline:  Goal status: Met 02/01/24   LONG TERM GOALS: Target date: 07/31/24  Patient independent with her advanced HEP.  Baseline:  Goal status: INITIAL  2.  Patient reassessed after her cancer treatments with new goals in place.  Baseline:  Goal status: INITIAL   PLAN:  PT FREQUENCY: 1x/week  PT DURATION: 6 months  PLANNED INTERVENTIONS: 97110-Therapeutic exercises, 97530- Therapeutic activity, [id number]- Neuromuscular re-education,  02464- Self Care, 02859- Manual therapy, Patient/Family education, Balance training, Joint mobilization, Spinal mobilization,  and Biofeedback  PLAN FOR NEXT SESSION: assess patient after radiation treatments; educate on use of dilators, make new goals   Channing Pereyra, PT 02/26/2024 9:31 AM  "

## 2024-02-26 NOTE — Patient Instructions (Signed)
 About Abdominal Massage  Abdominal massage, also called external colon massage, is a self-treatment circular massage technique that can reduce and eliminate gas and ease constipation. The colon naturally contracts in waves in a clockwise direction starting from inside the right hip, moving up toward the ribs, across the belly, and down inside the left hip.  When you perform circular abdominal massage, you help stimulate your colon's normal wave pattern of movement called peristalsis.  It is most beneficial when done after eating.  Positioning You can practice abdominal massage with oil while lying down, or in the shower with soap.  Some people find that it is just as effective to do the massage through clothing while sitting or standing.  How to Massage Start by placing your finger tips or knuckles on your right side, just inside your hip bone.  Make small circular movements while you move upward toward your rib cage.   Once you reach the bottom right side of your rib cage, take your circular movements across to the left side of the bottom of your rib cage.  Next, move downward until you reach the inside of your left hip bone.  This is the path your feces travel in your colon. Continue to perform your abdominal massage in this pattern for 10 minutes each day.     You can apply as much pressure as is comfortable in your massage.  Start gently and build pressure as you continue to practice.  Notice any areas of pain as you massage; areas of slight pain may be relieved as you massage, but if you have areas of significant or intense pain, consult with your healthcare provider.  Other Considerations General physical activity including bending and stretching can have a beneficial massage-like effect on the colon.  Deep breathing can also stimulate the colon because breathing deeply activates the same nervous system that supplies the colon.   Abdominal massage should always be used in combination with a  bowel-conscious diet that is high in the proper type of fiber for you, fluids (primarily water), and a regular exercise program.  Eulis Foster, PT [facility] [address], [address] [place], [place] [address] W: [phone] [email or web address]

## 2024-02-27 ENCOUNTER — Ambulatory Visit
Admission: RE | Admit: 2024-02-27 | Discharge: 2024-02-27 | Disposition: A | Discharge status: Home / Self Care | Payer: Self-pay | Source: Ambulatory Visit | Attending: Radiation Oncology | Admitting: Radiation Oncology

## 2024-02-27 ENCOUNTER — Other Ambulatory Visit: Payer: Self-pay

## 2024-02-27 LAB — RAD ONC ARIA SESSION SUMMARY
Course Elapsed Days: 9
Plan Fractions Treated to Date: 7
Plan Prescribed Dose Per Fraction: 1.8 Gy
Plan Total Fractions Prescribed: 30
Plan Total Prescribed Dose: 54 Gy
Reference Point Dosage Given to Date: 12.6 Gy
Reference Point Session Dosage Given: 1.8 Gy
Session Number: 7

## 2024-02-28 ENCOUNTER — Encounter: Admitting: Physical Therapy

## 2024-02-28 ENCOUNTER — Other Ambulatory Visit: Payer: Self-pay

## 2024-02-28 ENCOUNTER — Ambulatory Visit
Admission: RE | Admit: 2024-02-28 | Discharge: 2024-02-28 | Disposition: A | Discharge status: Home / Self Care | Payer: Self-pay | Source: Ambulatory Visit | Attending: Radiation Oncology | Admitting: Radiation Oncology

## 2024-02-28 LAB — RAD ONC ARIA SESSION SUMMARY
Course Elapsed Days: 10
Plan Fractions Treated to Date: 8
Plan Prescribed Dose Per Fraction: 1.8 Gy
Plan Total Fractions Prescribed: 30
Plan Total Prescribed Dose: 54 Gy
Reference Point Dosage Given to Date: 14.4 Gy
Reference Point Session Dosage Given: 1.8 Gy
Session Number: 8

## 2024-02-29 ENCOUNTER — Ambulatory Visit

## 2024-02-29 ENCOUNTER — Ambulatory Visit: Payer: Self-pay

## 2024-03-03 ENCOUNTER — Other Ambulatory Visit: Payer: Self-pay

## 2024-03-03 ENCOUNTER — Ambulatory Visit
Admission: RE | Admit: 2024-03-03 | Discharge: 2024-03-03 | Disposition: A | Payer: Self-pay | Source: Ambulatory Visit | Attending: Radiation Oncology

## 2024-03-03 LAB — RAD ONC ARIA SESSION SUMMARY
Course Elapsed Days: 14
Plan Fractions Treated to Date: 9
Plan Prescribed Dose Per Fraction: 1.8 Gy
Plan Total Fractions Prescribed: 30
Plan Total Prescribed Dose: 54 Gy
Reference Point Dosage Given to Date: 16.2 Gy
Reference Point Session Dosage Given: 1.8 Gy
Session Number: 9

## 2024-03-04 ENCOUNTER — Other Ambulatory Visit: Payer: Self-pay

## 2024-03-04 ENCOUNTER — Ambulatory Visit
Admission: RE | Admit: 2024-03-04 | Discharge: 2024-03-04 | Disposition: A | Payer: Self-pay | Source: Ambulatory Visit | Attending: Radiation Oncology

## 2024-03-04 ENCOUNTER — Ambulatory Visit: Admission: RE | Admit: 2024-03-04 | Discharge: 2024-03-04 | Attending: Radiation Oncology

## 2024-03-04 LAB — RAD ONC ARIA SESSION SUMMARY
Course Elapsed Days: 15
Plan Fractions Treated to Date: 10
Plan Prescribed Dose Per Fraction: 1.8 Gy
Plan Total Fractions Prescribed: 30
Plan Total Prescribed Dose: 54 Gy
Reference Point Dosage Given to Date: 18 Gy
Reference Point Session Dosage Given: 1.8 Gy
Session Number: 10

## 2024-03-05 ENCOUNTER — Ambulatory Visit

## 2024-03-05 ENCOUNTER — Ambulatory Visit
Admission: RE | Admit: 2024-03-05 | Discharge: 2024-03-05 | Disposition: A | Discharge status: Home / Self Care | Payer: Self-pay | Source: Ambulatory Visit | Attending: Radiation Oncology | Admitting: Radiation Oncology

## 2024-03-05 ENCOUNTER — Other Ambulatory Visit: Payer: Self-pay

## 2024-03-05 LAB — RAD ONC ARIA SESSION SUMMARY
Course Elapsed Days: 16
Plan Fractions Treated to Date: 11
Plan Prescribed Dose Per Fraction: 1.8 Gy
Plan Total Fractions Prescribed: 30
Plan Total Prescribed Dose: 54 Gy
Reference Point Dosage Given to Date: 19.8 Gy
Reference Point Session Dosage Given: 1.8 Gy
Session Number: 11

## 2024-03-06 ENCOUNTER — Other Ambulatory Visit: Payer: Self-pay

## 2024-03-06 ENCOUNTER — Ambulatory Visit
Admission: RE | Admit: 2024-03-06 | Discharge: 2024-03-06 | Disposition: A | Payer: Self-pay | Source: Ambulatory Visit | Attending: Radiation Oncology

## 2024-03-06 LAB — RAD ONC ARIA SESSION SUMMARY
Course Elapsed Days: 17
Plan Fractions Treated to Date: 12
Plan Prescribed Dose Per Fraction: 1.8 Gy
Plan Total Fractions Prescribed: 30
Plan Total Prescribed Dose: 54 Gy
Reference Point Dosage Given to Date: 21.6 Gy
Reference Point Session Dosage Given: 1.8 Gy
Session Number: 12

## 2024-03-07 ENCOUNTER — Other Ambulatory Visit: Payer: Self-pay

## 2024-03-07 ENCOUNTER — Ambulatory Visit
Admission: RE | Admit: 2024-03-07 | Discharge: 2024-03-07 | Disposition: A | Discharge status: Home / Self Care | Payer: Self-pay | Source: Ambulatory Visit | Attending: Radiation Oncology | Admitting: Radiation Oncology

## 2024-03-07 ENCOUNTER — Ambulatory Visit
Admission: RE | Admit: 2024-03-07 | Discharge: 2024-03-07 | Disposition: A | Discharge status: Home / Self Care | Source: Ambulatory Visit | Attending: Radiation Oncology | Admitting: Radiation Oncology

## 2024-03-07 LAB — RAD ONC ARIA SESSION SUMMARY
Course Elapsed Days: 18
Plan Fractions Treated to Date: 13
Plan Prescribed Dose Per Fraction: 1.8 Gy
Plan Total Fractions Prescribed: 30
Plan Total Prescribed Dose: 54 Gy
Reference Point Dosage Given to Date: 23.4 Gy
Reference Point Session Dosage Given: 1.8 Gy
Session Number: 13

## 2024-03-10 ENCOUNTER — Other Ambulatory Visit: Payer: Self-pay

## 2024-03-10 ENCOUNTER — Ambulatory Visit
Admission: RE | Admit: 2024-03-10 | Discharge: 2024-03-10 | Disposition: A | Payer: Self-pay | Source: Ambulatory Visit | Attending: Radiation Oncology

## 2024-03-10 ENCOUNTER — Other Ambulatory Visit: Payer: Self-pay | Admitting: Oncology

## 2024-03-10 DIAGNOSIS — C210 Malignant neoplasm of anus, unspecified: Secondary | ICD-10-CM

## 2024-03-10 LAB — RAD ONC ARIA SESSION SUMMARY
Course Elapsed Days: 21
Plan Fractions Treated to Date: 14
Plan Prescribed Dose Per Fraction: 1.8 Gy
Plan Total Fractions Prescribed: 30
Plan Total Prescribed Dose: 54 Gy
Reference Point Dosage Given to Date: 25.2 Gy
Reference Point Session Dosage Given: 1.8 Gy
Session Number: 14

## 2024-03-11 ENCOUNTER — Ambulatory Visit
Admission: RE | Admit: 2024-03-11 | Discharge: 2024-03-11 | Disposition: A | Payer: Self-pay | Source: Ambulatory Visit | Attending: Radiation Oncology

## 2024-03-11 ENCOUNTER — Other Ambulatory Visit: Payer: Self-pay

## 2024-03-11 LAB — RAD ONC ARIA SESSION SUMMARY
Course Elapsed Days: 22
Plan Fractions Treated to Date: 15
Plan Prescribed Dose Per Fraction: 1.8 Gy
Plan Total Fractions Prescribed: 30
Plan Total Prescribed Dose: 54 Gy
Reference Point Dosage Given to Date: 27 Gy
Reference Point Session Dosage Given: 1.8 Gy
Session Number: 15

## 2024-03-12 ENCOUNTER — Other Ambulatory Visit: Payer: Self-pay

## 2024-03-12 ENCOUNTER — Ambulatory Visit
Admission: RE | Admit: 2024-03-12 | Discharge: 2024-03-12 | Disposition: A | Payer: Self-pay | Source: Ambulatory Visit | Attending: Radiation Oncology

## 2024-03-12 LAB — RAD ONC ARIA SESSION SUMMARY
Course Elapsed Days: 23
Plan Fractions Treated to Date: 16
Plan Prescribed Dose Per Fraction: 1.8 Gy
Plan Total Fractions Prescribed: 30
Plan Total Prescribed Dose: 54 Gy
Reference Point Dosage Given to Date: 28.8 Gy
Reference Point Session Dosage Given: 1.8 Gy
Session Number: 16

## 2024-03-13 ENCOUNTER — Other Ambulatory Visit: Payer: Self-pay

## 2024-03-13 ENCOUNTER — Ambulatory Visit
Admission: RE | Admit: 2024-03-13 | Discharge: 2024-03-13 | Disposition: A | Discharge status: Home / Self Care | Payer: Self-pay | Source: Ambulatory Visit | Attending: Radiation Oncology | Admitting: Radiation Oncology

## 2024-03-13 LAB — RAD ONC ARIA SESSION SUMMARY
Course Elapsed Days: 24
Plan Fractions Treated to Date: 17
Plan Prescribed Dose Per Fraction: 1.8 Gy
Plan Total Fractions Prescribed: 30
Plan Total Prescribed Dose: 54 Gy
Reference Point Dosage Given to Date: 30.6 Gy
Reference Point Session Dosage Given: 1.8 Gy
Session Number: 17

## 2024-03-14 ENCOUNTER — Ambulatory Visit
Admission: RE | Admit: 2024-03-14 | Discharge: 2024-03-14 | Disposition: A | Source: Ambulatory Visit | Attending: Radiation Oncology

## 2024-03-14 ENCOUNTER — Ambulatory Visit

## 2024-03-14 ENCOUNTER — Ambulatory Visit: Payer: Self-pay

## 2024-03-14 ENCOUNTER — Other Ambulatory Visit: Payer: Self-pay

## 2024-03-14 LAB — RAD ONC ARIA SESSION SUMMARY
Course Elapsed Days: 25
Plan Fractions Treated to Date: 18
Plan Prescribed Dose Per Fraction: 1.8 Gy
Plan Total Fractions Prescribed: 30
Plan Total Prescribed Dose: 54 Gy
Reference Point Dosage Given to Date: 32.4 Gy
Reference Point Session Dosage Given: 1.8 Gy
Session Number: 18

## 2024-03-14 NOTE — Addendum Note (Signed)
 Encounter addended by: Janice Lynwood BROCKS on: 03/14/2024 8:51 AM  Actions taken: Imaging Exam ended

## 2024-03-17 ENCOUNTER — Inpatient Hospital Stay: Admitting: Oncology

## 2024-03-17 ENCOUNTER — Inpatient Hospital Stay

## 2024-03-17 ENCOUNTER — Inpatient Hospital Stay: Admitting: Dietician

## 2024-03-17 ENCOUNTER — Ambulatory Visit: Payer: Self-pay

## 2024-03-17 NOTE — Progress Notes (Signed)
 " Patient Care Team: Pa, [facility] as PCP - General Davonna Siad, MD as Medical Oncologist (Medical Oncology) Celestia Joesph SQUIBB, RN as Oncology Nurse Navigator (Medical Oncology)  Clinic Day:  03/18/2024  Referring physician: Davonna Siad, MD   CHIEF COMPLAINT:  CC: Stage IIB anal carcinoma   ASSESSMENT & PLAN:   Assessment & Plan: Brandi Mitchell  is a 64 y.o. 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 Local disease with positive lymph node.  Started chemo RT with 5FU and mitomycin  02/18/2024  -C1D28 today. Tolerated previous treatment well -Labs reviewed today: CMP: WNL, CBC: Normal WBC and platelets, Hb:11.9 -Physical exam stable today. Proceed with treatment today -Will assess in 8 weeks for response and repeat MRI/PCT scan in 3 months. -If patient has complete response, surveillance per NCCN guidelines: -DRE every 3-6 months for 5 years -Inguinal node palpation every 3-6 months for 5 years -Anoscopy every 6-12 months for 3 years -Annual CT imaging for 3 years (stage II-III)   Return to clinic in 8 weeks for a physical exam  Chemotherapy-induced diarrhea Persistent, watery, urgent diarrhea with most meals impacting quality of life and appetite. No associated nausea or vomiting.  - Recommended loperamide at the first sign of diarrhea and up to five times daily as needed. - Advised she may take loperamide prior to meals. - Recommended oral electrolyte replacement with sugar-free sports drinks (e.g., Gatorade). - Advised to avoid dietary sugar, as it may worsen diarrhea.  Chemotherapy-induced anemia Mild anemia expected with mitomycin -based chemotherapy. Laboratory values within acceptable limits for ongoing treatment.  - Reviewed laboratory results for anemia. - Provided reassurance regarding expected mild  anemia during treatment.   Genetic testing Family history of anal cancer in father and sister.  Genetic counseling-Considered low risk. No genetic testing done.      The patient understands the plans discussed today and is in agreement with them.  She knows to contact our office if she develops concerns prior to her next appointment.  35 minutes of total time was spent for this patient encounter, including preparation,face-to-face counseling with the patient and coordination of care, physical exam, and documentation of the encounter.   Siad Davonna, MD  [facility]  - A DEPT OF [facility] [address] [place] [address] Dept: [phone] Dept Fax: [phone]   No orders of the defined types were placed in this encounter.    ONCOLOGY HISTORY:   Diagnosis: Anal carcinoma   -Presentation: Palpable left inguinal mass -11/05/2023: Left Groin Soft Tissue US : The palpable mass corresponds to a 4.0 x 2.1 x 4.1 cm lobular hypoechoic mass 4 mm deep to the skin, probably a pathologic lymph node given location, but is nonspecific. -11/24/2023: CT AP: Similar size of left inguinal lymph node given differences in technique consider further evaluation by direct tissue sampling with ultrasound guidance. -01/02/2024: Left inguinal lymph node biopsy with hemorrhoidectomy.  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 An unoriented lateral margin is positive. The deep margin negative but is less than 1 mm. Inguinal lymph node positive for metastatic moderately differentiated squamous cell carcinoma.  -Patient has been referred to Dr. Debby for not clear margins -02/01/2024: PET scan: Mild hypermetabolism within the anal canal (SUV max 5.6) is nonspecific. Mild focal uptake in the  posterior deep left pelvis adjacent to the rectum (SUV max 4.4), suspicious for a left mesorectal  lymph node given corresponding 8 mm node on prior contrast CT. No hypermetabolic inguinal lymphadenopathy and no evidence of metastatic disease outside the pelvis. -02/15/2024: Port placed -02/18/2024- Current: Chemo RT with 5FU and mitomycin   Current Treatment:  Chemo RT with 5FU and mitomycin   INTERVAL HISTORY:  Discussed the use of AI scribe software for clinical note transcription with the patient, who gave verbal consent to proceed.  History of Present Illness Brandi Mitchell is a 64 year old female with anal cancer undergoing chemoradiation who presents for oncology follow-up and treatment today.  She is currently receiving mitomycin -based chemotherapy and daily radiation therapy for anal cancer.   She reports persistent, significant watery diarrhea occurring with every meal, described as gushing out of me like pure water. Diarrhea has not improved but she does not frequently use loperamide, and she is concerned about balancing diarrhea control with the risk of constipation.   She notes decreased appetite and is forcing herself to eat despite limited desire for food. She denies nausea or vomiting. Fatigue is present, which she partially attributes to her early morning work schedule, but she continues to work and maintains regular walking activity. She has noticed hair thinning consistent with ongoing chemotherapy. She has avoided lifting weights due to concern about her port and overhead movements, but was advised she may resume non-strenuous weightlifting as tolerated.  Laboratory testing today revealed mild anemia. She does not report symptoms of severe anemia.   I have reviewed the past medical history, past surgical history, social history and family history with the patient and they are unchanged from previous note.  ALLERGIES:  is allergic to levothyroxine  sodium, oxycodone hcl, indomethacin, keflex [cephalexin], percocet [oxycodone-acetaminophen ], and tramadol.  MEDICATIONS:   Current Outpatient Medications  Medication Sig Dispense Refill   amLODipine (NORVASC) 5 MG tablet Take 5 mg by mouth daily.     Calcium-Magnesium-Vitamin D (CITRACAL CALCIUM +D3) 600-40-500 MG-MG-UNIT TB24 Take 1 tablet by mouth daily at 12 noon.     cyanocobalamin 1000 MCG tablet Take 1,000 mcg by mouth daily.     FLUOROURACIL  IV Inject into the vein every 28 (twenty-eight) days.     levothyroxine  (SYNTHROID ) 88 MCG tablet Take 1 tablet by mouth daily. Brand name Synthroid      lidocaine -prilocaine  (EMLA ) cream Apply to affected area once 30 g 3   magic mouthwash w/lidocaine  SOLN Take 15 mLs by mouth 4 (four) times daily as needed for mouth pain. Suspension contains equal amounts of Maalox Extra Strength, nystatin, diphenhydramine  and lidocaine . Swish and swallow 1 tablespoon four times daily if the sores are in the back of the throat.  If the sores/pain are on the tongue, may swish and spit. 360 mL 1   MITOMYCIN  IV Inject into the vein every 28 (twenty-eight) days.     Omega-3 Fatty Acids (FISH OIL CONCENTRATE) 1000 MG CAPS Take 1,000 mg by mouth in the morning.     ondansetron  (ZOFRAN ) 8 MG tablet Take 1 tablet (8 mg total) by mouth every 8 (eight) hours as needed for nausea or vomiting. 30 tablet 1   prochlorperazine  (COMPAZINE ) 10 MG tablet Take 1 tablet (10 mg total) by mouth every 6 (six) hours as needed for nausea or vomiting. 30 tablet 1   telmisartan (MICARDIS) 40 MG tablet Take 1 tablet by mouth every evening.     No current facility-administered medications for this visit.     VITALS:  There were no vitals taken for this visit.  Wt Readings from Last 3 Encounters:  02/18/24 124 lb 9 oz (56.5 kg)  02/15/24 117 lb (53.1 kg)  01/30/24 123 lb 9.6 oz (56.1 kg)    There is no height or weight on file to calculate BMI.  Performance status (ECOG): 0 - Asymptomatic  PHYSICAL EXAM:   GENERAL:alert, no distress and comfortable SKIN: skin color, texture, turgor are normal, no  rashes or significant lesions, port site clean EYES: normal, Conjunctiva are pink and non-injected, sclera clear LYMPH:  no palpable lymphadenopathy in the cervical, axillary or inguinal LUNGS: clear to auscultation and percussion with normal breathing effort HEART: regular rate & rhythm and no murmurs and no lower extremity edema ABDOMEN:abdomen soft, non-tender and normal bowel sounds Musculoskeletal:no cyanosis of digits and no clubbing  NEURO: alert & oriented x 3 with fluent speech  LABORATORY DATA:  I have reviewed the data as listed     Component Value Date/Time   NA 139 02/18/2024 0930   K 3.9 02/18/2024 0930   CL 104 02/18/2024 0930   CO2 22 02/18/2024 0930   GLUCOSE 151 (H) 02/18/2024 0930   BUN 13 02/18/2024 0930   CREATININE 0.66 02/18/2024 0930   CALCIUM 9.0 02/18/2024 0930   PROT 6.8 02/18/2024 0930   ALBUMIN 4.2 02/18/2024 0930   AST 24 02/18/2024 0930   ALT 9 02/18/2024 0930   ALKPHOS 90 02/18/2024 0930   BILITOT 0.3 02/18/2024 0930   GFRNONAA >60 02/18/2024 0930   GFRAA >60 10/19/2017 0845     Lab Results  Component Value Date   WBC 5.0 02/18/2024   NEUTROABS 3.5 02/18/2024   HGB 12.8 02/18/2024   HCT 39.4 02/18/2024   MCV 86.8 02/18/2024   PLT 295 02/18/2024     RADIOGRAPHIC STUDIES: I have personally reviewed the radiological images as listed and agreed with the findings in the report. "

## 2024-03-18 ENCOUNTER — Inpatient Hospital Stay

## 2024-03-18 ENCOUNTER — Ambulatory Visit: Admission: RE | Admit: 2024-03-18 | Discharge: 2024-03-18 | Attending: Radiation Oncology

## 2024-03-18 ENCOUNTER — Inpatient Hospital Stay: Admitting: Oncology

## 2024-03-18 ENCOUNTER — Ambulatory Visit

## 2024-03-18 ENCOUNTER — Other Ambulatory Visit: Payer: Self-pay

## 2024-03-18 ENCOUNTER — Ambulatory Visit
Admission: RE | Admit: 2024-03-18 | Discharge: 2024-03-18 | Disposition: A | Payer: Self-pay | Source: Ambulatory Visit | Attending: Radiation Oncology

## 2024-03-18 DIAGNOSIS — C210 Malignant neoplasm of anus, unspecified: Secondary | ICD-10-CM

## 2024-03-18 DIAGNOSIS — T451X5A Adverse effect of antineoplastic and immunosuppressive drugs, initial encounter: Secondary | ICD-10-CM

## 2024-03-18 DIAGNOSIS — D6481 Anemia due to antineoplastic chemotherapy: Secondary | ICD-10-CM

## 2024-03-18 DIAGNOSIS — K521 Toxic gastroenteritis and colitis: Secondary | ICD-10-CM

## 2024-03-18 LAB — RAD ONC ARIA SESSION SUMMARY
Course Elapsed Days: 29
Plan Fractions Treated to Date: 19
Plan Prescribed Dose Per Fraction: 1.8 Gy
Plan Total Fractions Prescribed: 30
Plan Total Prescribed Dose: 54 Gy
Reference Point Dosage Given to Date: 34.2 Gy
Reference Point Session Dosage Given: 1.8 Gy
Session Number: 19

## 2024-03-18 LAB — COMPREHENSIVE METABOLIC PANEL WITH GFR
ALT: 12 U/L (ref 0–44)
AST: 19 U/L (ref 15–41)
Albumin: 4.2 g/dL (ref 3.5–5.0)
Alkaline Phosphatase: 68 U/L (ref 38–126)
Anion gap: 11 (ref 5–15)
BUN: 12 mg/dL (ref 8–23)
CO2: 26 mmol/L (ref 22–32)
Calcium: 8.9 mg/dL (ref 8.9–10.3)
Chloride: 105 mmol/L (ref 98–111)
Creatinine, Ser: 0.61 mg/dL (ref 0.44–1.00)
GFR, Estimated: >60 mL/min
Glucose, Bld: 95 mg/dL (ref 70–99)
Potassium: 3.9 mmol/L (ref 3.5–5.1)
Sodium: 142 mmol/L (ref 135–145)
Total Bilirubin: 0.4 mg/dL (ref 0.0–1.2)
Total Protein: 6.6 g/dL (ref 6.5–8.1)

## 2024-03-18 LAB — CBC WITH DIFFERENTIAL/PLATELET
Abs Immature Granulocytes: 0.02 10*3/uL (ref 0.00–0.07)
Basophils Absolute: 0 10*3/uL (ref 0.0–0.1)
Basophils Relative: 1 %
Eosinophils Absolute: 0.4 10*3/uL (ref 0.0–0.5)
Eosinophils Relative: 10 %
HCT: 36.4 % (ref 36.0–46.0)
Hemoglobin: 11.9 g/dL — ABNORMAL LOW (ref 12.0–15.0)
Immature Granulocytes: 1 %
Lymphocytes Relative: 5 %
Lymphs Abs: 0.2 10*3/uL — ABNORMAL LOW (ref 0.7–4.0)
MCH: 28.4 pg (ref 26.0–34.0)
MCHC: 32.7 g/dL (ref 30.0–36.0)
MCV: 86.9 fL (ref 80.0–100.0)
Monocytes Absolute: 0.5 10*3/uL (ref 0.1–1.0)
Monocytes Relative: 12 %
Neutro Abs: 2.8 10*3/uL (ref 1.7–7.7)
Neutrophils Relative %: 71 %
Platelets: 203 10*3/uL (ref 150–400)
RBC: 4.19 MIL/uL (ref 3.87–5.11)
RDW: 14.8 % (ref 11.5–15.5)
WBC: 4 10*3/uL (ref 4.0–10.5)
nRBC: 0 % (ref 0.0–0.2)

## 2024-03-18 LAB — MAGNESIUM: Magnesium: 2.1 mg/dL (ref 1.7–2.4)

## 2024-03-18 MED ORDER — PROCHLORPERAZINE MALEATE 10 MG PO TABS
10.0000 mg | ORAL_TABLET | Freq: Once | ORAL | Status: AC
  Administered 2024-03-18: 10 mg via ORAL
  Filled 2024-03-18: qty 1

## 2024-03-18 MED ORDER — SODIUM CHLORIDE 0.9 % IV SOLN
1000.0000 mg/m2/d | INTRAVENOUS | Status: DC
  Administered 2024-03-18: 6200 mg via INTRAVENOUS
  Filled 2024-03-18: qty 124

## 2024-03-18 MED ORDER — SODIUM CHLORIDE 0.9 % IV SOLN: INTRAVENOUS | Status: DC

## 2024-03-18 MED ORDER — MITOMYCIN CHEMO IV INJECTION 20 MG
10.0000 mg/m2 | Freq: Once | INTRAVENOUS | Status: AC
  Administered 2024-03-18: 15.5 mg via INTRAVENOUS
  Filled 2024-03-18: qty 31

## 2024-03-18 NOTE — Progress Notes (Signed)
 Infuse 5-Fluorouracil  pump at 2.8 ml/hr (~92 hours verses 96 hours) to complete ~ 1200 hours on 03/22/24.  T.O. Dr Ivery Molt, PharmD

## 2024-03-18 NOTE — Patient Instructions (Signed)
 [facility] [place] - A DEPT OF [facility]  Discharge Instructions: Thank you for choosing [facility] to provide your oncology and hematology care.  If you have a lab appointment with the Cancer Center - please note that after April 8th, 2024, all labs will be drawn in the cancer center.  You do not have to check in or register with the main entrance as you have in the past but will complete your check-in in the cancer center.  Wear comfortable clothing and clothing appropriate for easy access to any Portacath or PICC line.   We strive to give you quality time with your provider. You may need to reschedule your appointment if you arrive late (15 or more minutes).  Arriving late affects you and other patients whose appointments are after yours.  Also, if you miss three or more appointments without notifying the office, you may be dismissed from the clinic at the providers discretion.      For prescription refill requests, have your pharmacy contact our office and allow 72 hours for refills to be completed.    Today you received the following chemotherapy and/or immunotherapy agents mitomycin  and Adrucil .       To help prevent nausea and vomiting after your treatment, we encourage you to take your nausea medication as directed.  BELOW ARE SYMPTOMS THAT SHOULD BE REPORTED IMMEDIATELY: *FEVER GREATER THAN 100.4 F (38 C) OR HIGHER *CHILLS OR SWEATING *NAUSEA AND VOMITING THAT IS NOT CONTROLLED WITH YOUR NAUSEA MEDICATION *UNUSUAL SHORTNESS OF BREATH *UNUSUAL BRUISING OR BLEEDING *URINARY PROBLEMS (pain or burning when urinating, or frequent urination) *BOWEL PROBLEMS (unusual diarrhea, constipation, pain near the anus) TENDERNESS IN MOUTH AND THROAT WITH OR WITHOUT PRESENCE OF ULCERS (sore throat, sores in mouth, or a toothache) UNUSUAL RASH, SWELLING OR PAIN  UNUSUAL VAGINAL DISCHARGE OR ITCHING   Items with * indicate a potential emergency and should  be followed up as soon as possible or go to the Emergency Department if any problems should occur.  Please show the CHEMOTHERAPY ALERT CARD or IMMUNOTHERAPY ALERT CARD at check-in to the Emergency Department and triage nurse.  Should you have questions after your visit or need to cancel or reschedule your appointment, please contact [facility] [place] - A DEPT OF [facility] [phone]  and follow the prompts.  Office hours are 8:00 a.m. to 4:30 p.m. Monday - Friday. Please note that voicemails left after 4:00 p.m. may not be returned until the following business day.  We are closed weekends and major holidays. You have access to a nurse at all times for urgent questions. Please call the main number to the clinic [phone] and follow the prompts.  For any non-urgent questions, you may also contact your provider using MyChart. We now offer e-Visits for anyone 49 and older to request care online for non-urgent symptoms. For details visit [email or web address].   Also download the MyChart app! Go to the app store, search MyChart, open the app, select [place], and log in with your MyChart username and password.

## 2024-03-18 NOTE — Progress Notes (Signed)
Patient tolerated chemotherapy with no complaints voiced.  Side effects with management reviewed with understanding verbalized.  Port site clean and dry with no bruising or swelling noted at site.  Good blood return noted before and after administration of chemotherapy.  Dressing intact.   Patient left in satisfactory condition with VSS and no s/s of distress noted.  

## 2024-03-18 NOTE — Progress Notes (Signed)
 Patient has been examined by Dr. Davonna. Vital signs and labs have been reviewed by MD - ANC, Creatinine, LFTs, hemoglobin, and platelets have been reviewed by M.D. - pt may proceed with treatment.  Primary RN and pharmacy notified.

## 2024-03-18 NOTE — Progress Notes (Signed)
 Per Leotis RN at [facility] ok to cancel port flush with lab appointment today as the patient is going to [facility] later today for infusion.

## 2024-03-18 NOTE — Progress Notes (Signed)
 SPIRITUAL CARE AND COUNSELING CONSULT NOTE   VISIT SUMMARY    Reason for Visit: Chaplain making scheduled follow-up with Pt I previously connected with.  Description of Visit: I entered the room and found Brandi Mitchell sitting in the chair receiving her treatment, with no one there as her support person. Her husband had taken her to her radiation treatment in [place] and then brought her here, but he had to go to work.   Brandi Mitchell was quick to begin processing her journey and faith aloud and we worked together to find the meaning in it.  She expresses hope and peace about the journey and her fate, and remarks that she is surprised that she does not feel afraid.   Brandi Mitchell speaks also of her appreciation for her job at the [employer] where she is able to engage with people- this is not only a ministry for her, but a useful coping skill as well.   We are inturrupted in our visit by the Dr coming to speak with Pt.  I will follow up again soon.  Plan of Care: I will continue to follow Brandi Mitchell on a bi-weekly basis.   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 Community/Connection: Family, Friend(s), Other (comment) ([employer]) Strengths: Spirituality, [place], [place], and humor Needs/Challenges/Barriers: [place] to balance treatment and her work (whcih she loves) Patient Stress Factors: Health changes, Loss of control Family Stress Factors: Health changes,  Major life changes, Other (Comment) (Does not want to talk about death)   GOALS   Self/Personal Goals: Pt states she wants to glorify God through her cancer journey Clinical Care Goals: creat and maintain space conducive for spiritual care   INTERVENTIONS   Spiritual Care Interventions Made: Compassionate presence, Reflective listening, Narrative/life review, Explored values/beliefs/practices/strengths, Meaning making    INTERVENTION OUTCOMES   Outcomes: Connection to spiritual care, Reduced isolation, Awareness of support  SPIRITUAL CARE PLAN   Spiritual Care Issues Still Outstanding: Chaplain will continue to follow   Maude Roll, MDiv Chaplain, [facility] [email or web address] [phone] 03/18/2024 3:39 PM

## 2024-03-18 NOTE — Progress Notes (Signed)
 SPIRITUAL CARE AND COUNSELING CONSULT NOTE   VISIT SUMMARY    Reason for Visit: Chaplain making scheduled follow-up with Pt I previously connected with.   Description of Visit: I entered the room and found Brandi Mitchell sitting in the chair receiving her treatment, with no one there as her support person. Her husband had taken her to her radiation treatment in [place] and then brought her here, but he had to go to work.               [name] was quick to begin processing her journey and faith aloud and we worked together to find the meaning in it.  She expresses hope and peace about the journey and her fate, and remarks that she is surprised that she does not feel afraid.               [name] speaks also of her appreciation for her job at the [employer] where she is able to engage with people- this is not only a ministry for her, but a useful coping skill as well.               We are inturrupted in our visit by the Dr coming to speak with Pt.  I will follow up again soon.   Plan of Care: I will continue to follow [name] on a bi-weekly basis.   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, 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), Other (comment) ([employer]) Strengths: Spirituality, [place], [place], and humor Needs/Challenges/Barriers: [place] to balance treatment and her work (whcih she loves) Patient Stress Factors: Health changes, Loss of  control Family Stress Factors: Health changes, Major life changes, Other (Comment) (Does not want to talk about death)   GOALS   Self/Personal Goals: Pt states she wants to glorify God through her cancer journey Clinical Care Goals: creat and maintain space conducive for spiritual care   INTERVENTIONS   Spiritual Care Interventions Made: Compassionate presence, Reflective listening, Narrative/life review, Explored values/beliefs/practices/strengths, Meaning making    INTERVENTION OUTCOMES   Outcomes: Connection to spiritual care, Reduced isolation, Awareness of support  SPIRITUAL CARE PLAN   Spiritual Care Issues Still Outstanding: Chaplain will continue to follow   Maude Roll, MDiv Chaplain, [facility] [email or web address]  [phone] 03/18/2024 3:45 PM

## 2024-03-18 NOTE — Patient Instructions (Addendum)
 [facility] at [facility] Discharge Instructions   You were seen and examined today by Dr. Davonna.  She reviewed the results of your lab work which are normal/stable.   We will proceed with your treatment today. We will see you 2 months after you complete you radiation treatments for exam. We will plan to repeat scans 3 months after completion of radiation treatments to assess treatment response.   Return as scheduled.    Thank you for choosing [facility] at [facility] to provide your oncology and hematology care.  To afford each patient quality time with our provider, please arrive at least 15 minutes before your scheduled appointment time.   If you have a lab appointment with the Cancer Center please come in thru the Main Entrance and check in at the main information desk.  You need to re-schedule your appointment should you arrive 10 or more minutes late.  We strive to give you quality time with our providers, and arriving late affects you and other patients whose appointments are after yours.  Also, if you no show three or more times for appointments you may be dismissed from the clinic at the providers discretion.     Again, thank you for choosing [facility].  Our hope is that these requests will decrease the amount of time that you wait before being seen by our physicians.       _____________________________________________________________  Should you have questions after your visit to [facility], please contact our office at [phone] and follow the prompts.  Our office hours are 8:00 a.m. and 4:30 p.m. Monday - Friday.  Please note that voicemails left after 4:00 p.m. may not be returned until the following business day.  We are closed weekends and major holidays.  You do have access to a nurse 24-7, just call the main number to the clinic [phone] and do not press any options, hold on the line and a  nurse will answer the phone.    For prescription refill requests, have your pharmacy contact our office and allow 72 hours.    Due to Covid, you will need to wear a mask upon entering the hospital. If you do not have a mask, a mask will be given to you at the Main Entrance upon arrival. For doctor visits, patients may have 1 support person age 71 or older with them. For treatment visits, patients can not have anyone with them due to social distancing guidelines and our immunocompromised population.

## 2024-03-19 ENCOUNTER — Inpatient Hospital Stay

## 2024-03-19 ENCOUNTER — Ambulatory Visit
Admission: RE | Admit: 2024-03-19 | Discharge: 2024-03-19 | Disposition: A | Payer: Self-pay | Source: Ambulatory Visit | Attending: Radiation Oncology

## 2024-03-19 ENCOUNTER — Other Ambulatory Visit: Payer: Self-pay

## 2024-03-19 LAB — RAD ONC ARIA SESSION SUMMARY
Course Elapsed Days: 30
Plan Fractions Treated to Date: 20
Plan Prescribed Dose Per Fraction: 1.8 Gy
Plan Total Fractions Prescribed: 30
Plan Total Prescribed Dose: 54 Gy
Reference Point Dosage Given to Date: 36 Gy
Reference Point Session Dosage Given: 1.8 Gy
Session Number: 20

## 2024-03-20 ENCOUNTER — Ambulatory Visit
Admission: RE | Admit: 2024-03-20 | Discharge: 2024-03-20 | Disposition: A | Discharge status: Home / Self Care | Payer: Self-pay | Source: Ambulatory Visit | Attending: Radiation Oncology | Admitting: Radiation Oncology

## 2024-03-20 ENCOUNTER — Other Ambulatory Visit: Payer: Self-pay

## 2024-03-20 LAB — RAD ONC ARIA SESSION SUMMARY
Course Elapsed Days: 31
Plan Fractions Treated to Date: 21
Plan Prescribed Dose Per Fraction: 1.8 Gy
Plan Total Fractions Prescribed: 30
Plan Total Prescribed Dose: 54 Gy
Reference Point Dosage Given to Date: 37.8 Gy
Reference Point Session Dosage Given: 1.8 Gy
Session Number: 21

## 2024-03-21 ENCOUNTER — Inpatient Hospital Stay

## 2024-03-21 ENCOUNTER — Ambulatory Visit
Admission: RE | Admit: 2024-03-21 | Discharge: 2024-03-21 | Disposition: A | Source: Ambulatory Visit | Attending: Radiation Oncology

## 2024-03-21 ENCOUNTER — Ambulatory Visit
Admission: RE | Admit: 2024-03-21 | Discharge: 2024-03-21 | Disposition: A | Payer: Self-pay | Source: Ambulatory Visit | Attending: Radiation Oncology

## 2024-03-21 ENCOUNTER — Other Ambulatory Visit: Payer: Self-pay

## 2024-03-21 LAB — RAD ONC ARIA SESSION SUMMARY
Course Elapsed Days: 32
Plan Fractions Treated to Date: 22
Plan Prescribed Dose Per Fraction: 1.8 Gy
Plan Total Fractions Prescribed: 30
Plan Total Prescribed Dose: 54 Gy
Reference Point Dosage Given to Date: 39.6 Gy
Reference Point Session Dosage Given: 1.8 Gy
Session Number: 22

## 2024-03-22 ENCOUNTER — Inpatient Hospital Stay

## 2024-03-24 ENCOUNTER — Ambulatory Visit: Payer: Self-pay

## 2024-03-24 ENCOUNTER — Ambulatory Visit

## 2024-03-25 ENCOUNTER — Other Ambulatory Visit: Payer: Self-pay

## 2024-03-25 ENCOUNTER — Ambulatory Visit
Admission: RE | Admit: 2024-03-25 | Discharge: 2024-03-25 | Disposition: A | Source: Ambulatory Visit | Attending: Radiation Oncology

## 2024-03-25 LAB — RAD ONC ARIA SESSION SUMMARY
Course Elapsed Days: 36
Plan Fractions Treated to Date: 23
Plan Prescribed Dose Per Fraction: 1.8 Gy
Plan Total Fractions Prescribed: 30
Plan Total Prescribed Dose: 54 Gy
Reference Point Dosage Given to Date: 41.4 Gy
Reference Point Session Dosage Given: 1.8 Gy
Session Number: 23

## 2024-03-26 ENCOUNTER — Other Ambulatory Visit: Payer: Self-pay

## 2024-03-26 ENCOUNTER — Ambulatory Visit: Payer: Self-pay | Admitting: Physical Therapy

## 2024-03-26 ENCOUNTER — Ambulatory Visit
Admission: RE | Admit: 2024-03-26 | Discharge: 2024-03-26 | Disposition: A | Source: Ambulatory Visit | Attending: Radiation Oncology

## 2024-03-26 LAB — RAD ONC ARIA SESSION SUMMARY
Course Elapsed Days: 37
Plan Fractions Treated to Date: 24
Plan Prescribed Dose Per Fraction: 1.8 Gy
Plan Total Fractions Prescribed: 30
Plan Total Prescribed Dose: 54 Gy
Reference Point Dosage Given to Date: 43.2 Gy
Reference Point Session Dosage Given: 1.8 Gy
Session Number: 24

## 2024-03-27 ENCOUNTER — Telehealth: Payer: Self-pay | Admitting: *Deleted

## 2024-03-27 ENCOUNTER — Ambulatory Visit
Admission: RE | Admit: 2024-03-27 | Discharge: 2024-03-27 | Disposition: A | Source: Ambulatory Visit | Attending: Radiation Oncology

## 2024-03-27 ENCOUNTER — Other Ambulatory Visit: Payer: Self-pay

## 2024-03-27 ENCOUNTER — Ambulatory Visit

## 2024-03-27 ENCOUNTER — Other Ambulatory Visit: Payer: Self-pay | Admitting: *Deleted

## 2024-03-27 LAB — RAD ONC ARIA SESSION SUMMARY
Course Elapsed Days: 38
Plan Fractions Treated to Date: 25
Plan Prescribed Dose Per Fraction: 1.8 Gy
Plan Total Fractions Prescribed: 30
Plan Total Prescribed Dose: 54 Gy
Reference Point Dosage Given to Date: 45 Gy
Reference Point Session Dosage Given: 1.8 Gy
Session Number: 25

## 2024-03-27 NOTE — Telephone Encounter (Signed)
 Patient called to advise that she is continuing to have up to 4 diarrheal stools daily and is getting radiation, which is causing much discomfort.  She has used imodium as directed without success.  Sent script to Dr. Davonna to approve for Lomotil .  States that she has been using Aquaphor on affected area and questioned as to if there was another option.  Directed her to contact Radiation Oncology for recommendations.  Verbalized understanding.

## 2024-03-28 ENCOUNTER — Encounter: Payer: Self-pay | Admitting: Oncology

## 2024-03-28 ENCOUNTER — Ambulatory Visit: Admission: RE | Admit: 2024-03-28

## 2024-03-28 ENCOUNTER — Other Ambulatory Visit: Payer: Self-pay

## 2024-03-28 ENCOUNTER — Ambulatory Visit: Admission: RE | Admit: 2024-03-28 | Source: Ambulatory Visit

## 2024-03-28 LAB — RAD ONC ARIA SESSION SUMMARY
Course Elapsed Days: 39
Plan Fractions Treated to Date: 26
Plan Prescribed Dose Per Fraction: 1.8 Gy
Plan Total Fractions Prescribed: 30
Plan Total Prescribed Dose: 54 Gy
Reference Point Dosage Given to Date: 46.8 Gy
Reference Point Session Dosage Given: 1.8 Gy
Session Number: 26

## 2024-03-28 MED ORDER — DIPHENOXYLATE-ATROPINE 2.5-0.025 MG PO TABS: 2.0000 | ORAL_TABLET | Freq: Four times a day (QID) | ORAL | 0 refills | Status: AC | PRN

## 2024-03-31 ENCOUNTER — Ambulatory Visit

## 2024-03-31 ENCOUNTER — Inpatient Hospital Stay: Admitting: Dietician

## 2024-04-01 ENCOUNTER — Ambulatory Visit

## 2024-04-02 ENCOUNTER — Ambulatory Visit

## 2024-04-03 ENCOUNTER — Ambulatory Visit

## 2024-04-14 ENCOUNTER — Ambulatory Visit: Admitting: Physical Therapy

## 2024-04-21 ENCOUNTER — Ambulatory Visit: Admitting: Physical Therapy

## 2024-04-28 ENCOUNTER — Ambulatory Visit: Admitting: Physical Therapy

## 2024-05-05 ENCOUNTER — Ambulatory Visit: Admitting: Physical Therapy

## 2024-05-12 ENCOUNTER — Ambulatory Visit: Admitting: Physical Therapy

## 2024-05-19 ENCOUNTER — Ambulatory Visit: Admitting: Physical Therapy

## 2024-05-26 ENCOUNTER — Ambulatory Visit: Admitting: Physical Therapy

## 2024-06-04 ENCOUNTER — Inpatient Hospital Stay: Admitting: Oncology

## 2024-06-04 ENCOUNTER — Inpatient Hospital Stay
